# Patient Record
Sex: Female | Born: 1945 | Race: Black or African American | Hispanic: No | Marital: Married | Smoking: Former smoker
Health system: Southern US, Community
[De-identification: ages and names within clinical notes are randomized; demographics above are authoritative.]

## PROBLEM LIST (undated history)

## (undated) DIAGNOSIS — E119 Type 2 diabetes mellitus without complications: Secondary | ICD-10-CM

## (undated) DIAGNOSIS — I10 Essential (primary) hypertension: Secondary | ICD-10-CM

## (undated) HISTORY — PX: BREAST SURGERY: SHX581

---

## 2004-10-24 ENCOUNTER — Other Ambulatory Visit: Payer: Self-pay

## 2004-10-24 ENCOUNTER — Emergency Department: Payer: Self-pay | Admitting: Emergency Medicine

## 2006-03-29 ENCOUNTER — Emergency Department: Payer: Self-pay | Admitting: Internal Medicine

## 2006-07-05 ENCOUNTER — Ambulatory Visit: Payer: Self-pay | Admitting: Unknown Physician Specialty

## 2006-10-04 ENCOUNTER — Ambulatory Visit: Payer: Self-pay | Admitting: Internal Medicine

## 2011-03-15 ENCOUNTER — Emergency Department: Payer: Self-pay | Admitting: Unknown Physician Specialty

## 2011-07-24 ENCOUNTER — Ambulatory Visit: Payer: Self-pay | Admitting: Internal Medicine

## 2011-11-02 DIAGNOSIS — I10 Essential (primary) hypertension: Secondary | ICD-10-CM | POA: Diagnosis present

## 2013-12-05 ENCOUNTER — Ambulatory Visit: Payer: Self-pay | Admitting: Family Medicine

## 2014-12-06 ENCOUNTER — Inpatient Hospital Stay: Payer: Self-pay | Admitting: Internal Medicine

## 2015-02-24 NOTE — Discharge Summary (Signed)
PATIENT NAME:  [name], [name] MR#:  [id number] DATE OF BIRTH:  1946-08-15  DATE OF ADMISSION:  12/06/2014 DATE OF DISCHARGE:  12/10/2014  ADMITTING DIAGNOSES:  1.  Diabetic ketoacidosis.  2.  Altered mental status.  3.  Lactic acidosis.   DISCHARGE DIAGNOSES:  1.  Altered mental status/encephalopathy secondary to diabetic ketoacidosis.  2.  Lactic acidosis. 3.  Elevated troponin.   CONSULTATIONS: Psychiatry.   PROCEDURES: None.   BRIEF HISTORY VISIT AND HOSPITAL COURSE: The patient is a 69 year old female with history of diabetes mellitus is brought into the Emergency Department by family members because of altered mental status. Please see history and physical for details. Her blood sugar is more than 700 with pH 7.22 and anion gap of 20. The patient was given IV fluids and she was started on insulin drip. There was a concern about some drug overdose and the patient was made IVC by the ER physician and psychiatric consult was placed. The patient has diabetes with diabetic retinopathy and she is legally blind.   HOSPITAL COURSE: Based on the problems.  1.  Diabetic ketoacidosis. The patient was confused and had altered mental status that seemed to be from DKA. The patient was admitted to Intensive Care Unit, IV fluids and insulin drip was initiated per protocol.  Basic metabolic profile followed every six hours and eventually when anion gap was closed and bicarbonate was normalized insulin drip was discontinued and patient is started on Lantus subcutaneously and sliding scale insulin was initiated. Inpatient diabetic consult was placed. The patient's hemoglobin A1C was at 14, which indicates uncontrolled diabetes mellitus. Her Lantus dose was adjusted diabetic teaching and insulin teaching was provided. Eventually decision was made to discharge the patient with Lantus 30 units subcutaneously once daily.  2.  Lactic acidosis. Initially, the patient was started on IV empiric antibiotics but  cultures revealed no growth so antibiotics were subsequently discontinued.  3.  Metabolic encephalopathy secondary to severe DKA. CT head is unremarkable and eventually encephalopathy was resolved.  4.  Elevated troponin probably from demand ischemia from diabetic ketoacidosis. Echocardiogram has revealed ejection fraction greater than 55%. The patient was recommended to get a stress test done as an outpatient. The patient refused inpatient stress test. The plan is to continue aspirin, beta blocker, statin and ACE inhibitor.  5.  Possible depression is a concern for drug overdose. The patient was continued on Wellbutrin and psych has evaluated. After having a conversation with the patient, her IVC was discontinued.  6.  Deconditioning with physical therapy. Skilled nursing facility was recommended by physical therapy, but the patient prefers going home with home health. Case management was consulted and home PT and RN was arranged. As the patient is legally blind, family members, as well as the patient were trained to use insulin pen, using clicks to draw insulin.   CONDITION: Stable at the time of discharge.    CODE STATUS: She is FULL CODE.   PHYSICAL EXAMINATION:  VITAL SIGNS: At the time of discharge, temperature 98.6, pulse 91, respirations 18, blood pressure 146/70, pulse oximetry is 96% to 99%.  GENERAL APPEARANCE: Not in acute distress. Moderately built and nourished.  HEENT: Normocephalic, atraumatic. Pupils are equal, light and accommodation. No scleral icterus. No conjunctival injection. No sinus tenderness. No postnasal drip. Moist mucous membranes.  NECK: Supple. No JVD. No thyromegaly. Range of motion is intact.  LUNGS: Clear to auscultation bilaterally. No accessory muscle use and no anterior chest wall tenderness on palpation.  CARDIAC: S1, S2 normal. Regular rate and rhythm.  No murmurs.  GASTROINTESTINAL: Soft. Bowel sounds are positive in all four quadrants. Nontender,  nondistended. No masses.  NEUROLOGIC: Awake, alert and oriented x 3.  Cranial nerves  II through XII are grossly intact. No cerebellar signs.  LABORATORY DATA:  Echocardiogram: Left ventricular ejection fraction 55% to 60%, normal left ventricular systolic function, depressed left ventricular internal cavity size, mild tricuspid regurgitation. Her Accu-Cheks on February 15 were 152, 187, 148. On February 14, glucose 211, BUN normal. Creatinine, sodium, and potassium are normal. Anion gap is at 12, GFR greater than 60. Serum osmolality is 290. Calcium is 7.9. WBC 9.5, hemoglobin 7.7, hematocrit 24.3, platelets are 175,000.   MEDICATIONS AT THE TIME OF DISCHARGE:  Lantus 30 units subcutaneously once daily, gabapentin 100 mg 2 capsules p.o. once daily in the evening, bupropion 150 mg 2 tablets p.o. once daily in a.m., gabapentin 100 mg 1 capsule p.o. once daily in a.m., lisinopril 5 mg p.o. once daily, metoprolol tartrate 25 mg p.o. 2 times a day, Colace 100 mg p.o. b.i.d. as needed for constipation, aspirin 81 mg 1 tablet p.o. once daily, simvastatin 40 mg p.o. once daily at bedtime, aspart insulin subcutaneously 3 times a day with meals per protocol: Less than 150 no coverage, 151-200 use 2 units, 201-250 use 4 units, 251-300 use 6 units, 301-350 use 8 units, and 351-400 use 10 units. Discontinued acetazolamide, prednisone, as well as amlodipine.   DISPOSITION: Discharging home with home health with physical therapy nurse and nurse aide.   DIET: ADA, regular consistency.   FOLLOWUP: With the primary care physician in 1 week. Cardiology for outpatient stress test with Dr. Neoma Laming in 1-2 weeks. Outpatient diabetic clinic in 1-2 weeks.   The diagnosis and plan of care was discussed in detail with the patient and she verbalized understanding of the plan.   Total Time Spent on discharge is 45 minutes and her questions were answered.       ____________________________ Nicholes Mango,  MD ag:at D: 12/15/2014 17:29:05 ET T: 12/15/2014 18:13:02 ET JOB#: [id number]  cc: Nicholes Mango, MD, <Dictator> Dionisio David, MD

## 2015-02-24 NOTE — Consult Note (Signed)
PATIENT NAME:  [name], Stottlemyre MR#:  [id number] DATE OF BIRTH:  [birth year]  DATE OF CONSULTATION:  12/09/2014  REFERRING PHYSICIAN:    CONSULTING PHYSICIAN:  Roarke Marciano K. Dynasia Kercheval, MD  SUBJECTIVE: The patient was seen in consultation in [facility], Room 138. 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 The patient was seen with her husband and her sister-in-law. The patient was asked to be seen in consultation because she was seen hallucinating yesterday, 12/08/2014, when IVC was taken out by the floor physician. Today, the nursing staff reports that she is not hallucinating and, in fact, the 1-on-1 sitter reports that she has not had any problems with hallucinating. The patient has also stated "I was confused yesterday, not today. Tomorrow I may be." The patient has sense of humor.   PAST PSYCHIATRIC HISTORY: No previous history of inpatient hospital psychiatry, no history of suicide attempt, not being following by any psychiatrist and never had any mental issues or problems.   SOCIAL HISTORY: Denied drinking alcohol, denied street or prescription drug abuse, denied smoking nicotine cigarettes.  MENTAL STATUS: The patient is seen, sitting up and comfortable in her bed. Alert and oriented. She stated "they brought me here for help." No agitation. Affect is neutral. Mood stable. Denies feeling depressed. Denies feeling hopeless, helpless. The patient has sense of humor and was joking during the interview. Denies feeling confused and said "not now." No psychosis. Does not appear to be responding to internal stimuli. Denies auditory or visual hallucinations and says that yesterday, on 12/08/2014, she did have problem with the same. Memory is intact. General knowledge of information is fair. She knew the capital of [place] and  the name of the current president. Cognition is intact. Denies suicidal or homicidal plans. Insight and judgment fair and adequate. Impulse control is fair.   IMPRESSION: Confusion secondary to medical problems of metabolic acidosis or electrolyte disturbance.   RECOMMEND: Discontinuing involuntary commitment, as the patient is safe and does not need any IVC at this time, is cooperative and competent to deal with her problems and make decisions.     ____________________________ Wallace Cullens. Franchot Mimes, MD skc:TT D: 12/09/2014 15:28:40 ET T: 12/09/2014 15:46:07 ET JOB#: [id number]  cc: Arlyn Leak K. Franchot Mimes, MD, <Dictator> Dewain Penning MD ELECTRONICALLY SIGNED 12/15/2014 17:28

## 2015-02-24 NOTE — H&P (Signed)
PATIENT NAME:  [name], [name] MR#:  [id number] DATE OF BIRTH:  September 09, 1946  DATE OF ADMISSION:  12/06/2014  PRIMARY PHYSICIAN:Margaret Stinson  EMERGENCY ROOM PHYSICIAN: Joanne Gavel, MD   CHIEF COMPLAINT: Altered mental status.   HISTORY OF PRESENT ILLNESS: A 69 year old female patient with hypertension, diabetes, brought in by family because of altered mental status. The patient is not feeling well for the past  3 to 4 days, and she had a fall yesterday. She would not want to come to the Emergency Room. So family went to DSS and got IVC papers and called ambulance. The patient brought in by EMS because of altered mental status. The patient's thought to have some   drug overdose, so she was on the behavioral health side, but later on found out that she has DKA with severely elevated blood sugars, (BG  more than >>  700, and anion gap of 20, pH of 7.22. The patient is very confused at this time. The patient would not let the IV placed by the nurse, so ER physician gave a small dose of Ativan. After the patient became calm, right now  she is IVC. She has seen by the ER physician and reportedly she was alert, oriented and spoke to physician  but she is feeling overwhelmed and feels very tired; unable to tell if she has any complaints. History obtained from family, according to the family, she is feeling sick for 3 to 4 days, not eaten anything, associated fall last night. The patient had a fall due to balance issues. She does have diabetes and has diabetic retinopathy, and legally blind. She has no history of recent diarrhea, or nausea, or vomiting. No fever.   PAST MEDICAL HISTORY:  She has history of type 2 diabetes mellitus, hypertension, (diabetic   retinopathy.   ALLERGIES: No known allergies.    SOCIAL HISTORY: The patient lives with husband. No history of smoking or drinking.   PAST SURGICAL HISTORY: laser surgery  for the eyes for diabetic retinopathy.   FAMILY HISTORY: The patient's mother  had diabetes.   LAST HOSPITALIZATION: Not been hospitalized here.   MEDICATIONS: Reportedly, patient takes Lantus 30 units at night, and family is not sure of her metformin and Neurontin dose, and we will call her [facility] in [place] and get the list.   REVIEW OF SYSTEMS: Not obtainable because the patient is confused at this time.   PHYSICAL EXAMINATION: VITALS: Temperature is 99.4, blood pressure 185/124, saturations are 90% on room air, heart rate 134.  GENERAL: The patient is confused and unable to give any complaints.  HEENT:Atruamtic,normocephalic, Pupils reacting to light. No scleral icterus. No conjunctivitis. Hearing is intact.no turbinate hypertrophy, The patient has no pharyngeal edema. Mucous membranes are very dry.  NECK: Thyroid is nontender, nonenlarged, supple. No masses. No lymphadenopathy. No JVD.    Lungs; clear to auscultation. No rales. No wheezes. Not using accessory muscles of respiration.  CARDIOVASCULAR: S1, S2 regular. No murmurs. Tachycardic, PMI not displaced. Good pedal pulse. Good femoral pulses. No extremity edema.  ABDOMEN: Soft, nontender, nondistended. Bowel sounds present. No organomegaly. No hernias.  MUSCULOSKELETAL: Able to move all extremities x 4, no kyphosis.  SKIN: Decreased skin turgor.  NEUROLOGIC: Patient has no obvious neurological deficit.  PSYCHIATRIC: She is very confused at this time and also agitated, trying to get out of the bed.  NEUROLOGIC: Unable to be done because patient is confused, not able to participate but grossly has no fascial droop and no  flaccidity.   DIAGNOSTIC DATA:  1.  The patient's electrolytes show sodium 125, potassium 4.1, chloride 90, bicarbonate 15, BUN 29, creatinine 1.63, glucose 721; the patient's ALT is 10, AST is 8, patient's anion gap of 20; WBC 21.4, hemoglobin 10.8, hematocrit 34.8, platelets 236,000; patient's lipase 111,  lactic acid 4.5.  2.  Chest x-ray shows no active disease.  3.  CT head shows no  intracranial abnormalities,soft tissue injury   consistent with recent fall. 4.  Magnesium 1.9, troponin less than 0.02, phosphorus 3.   ASSESSMENT AND PLAN: 1.  This is a 69 year old female patient with altered mental status likely secondary to severe diabetic ketoacidosis. The patient is admitted to intensive care unit, started on insulin drip and IV fluids. Patient has hyponatremia, acute renal failure secondary to be diabetic ketoacidosis. Check  BMP every 6 hours and adjust the fluids. confusion;Metabolic   encephalopathy secondary to severe diabetic ketoacidosis. CT head is unremarkable; the patient right now is monitored closely and use goedon  20 mg q12 IM  as needed for agitation.  3.  Lactic acidosis; Empirically started on broad-spectrum antibiotics. follow blood cultures,if negative,d/c abx. 4.  Deep vein thrombosis prophylaxis with Lovenox.   Discussed the plan with the husband and patient's son in detail.   Time spent more than 60 minutes.   This is a critical care H and P.    ____________________________ Epifanio Lesches, MD sk:nt D: 12/06/2014 20:00:14 ET T: 12/06/2014 20:38:56 ET JOB#: [id number]  cc: Epifanio Lesches, MD, <Dictator> Epifanio Lesches MD ELECTRONICALLY SIGNED 12/11/2014 21:45

## 2015-02-24 NOTE — Discharge Summary (Signed)
PATIENT NAME:  [name], [name] MR#:  [id number] DATE OF BIRTH:  [birth year]  DATE OF ADMISSION:  12/06/2014 DATE OF DISCHARGE:  12/10/2014  ADMITTING DIAGNOSES:  1.  Diabetic ketoacidosis.  2.  Altered mental status.  3.  Lactic acidosis.   DISCHARGE DIAGNOSES:  1.  Altered mental status/encephalopathy secondary to diabetic ketoacidosis.  2.  Lactic acidosis. 3.  Elevated troponin.   CONSULTATIONS: Psychiatry.   PROCEDURES: None.   BRIEF HISTORY VISIT AND HOSPITAL COURSE: The patient is a 69 year old female with history of diabetes mellitus is brought into the Emergency Department by family members because of altered mental status. Please see history and physical for details. Her blood sugar is more than 700 with pH 7.22 and anion gap of 20. The patient was given IV fluids and she was started on insulin drip. There was a concern about some drug overdose and the patient was made IVC by the ER physician and psychiatric consult was placed. The patient has diabetes with diabetic retinopathy and she is legally blind.   HOSPITAL COURSE: Based on the problems.  1. Diabetic ketoacidosis. The patient was confused and had altered mental status that seemed to be from DKA. The patient was admitted to Intensive Care Unit, IV fluids and insulin drip was initiated per protocol.  Basic metabolic profile followed every six hours and eventually when anion gap was closed and bicarbonate was normalized insulin drip was discontinued and patient is started on Lantus subcutaneously and sliding scale insulin was initiated. Inpatient diabetic consult was placed. The patient's hemoglobin A1C was at 14, which indicates uncontrolled diabetes mellitus. Her Lantus dose was adjusted diabetic teaching and insulin teaching was provided. Eventually decision was made to discharge the patient with Lantus 30 units subcutaneously once daily.  2. Lactic acidosis. Initially, the patient was started on IV empiric antibiotics but  cultures revealed no growth so antibiotics were subsequently discontinued.  3. Metabolic encephalopathy secondary to severe DKA. CT head is unremarkable and eventually encephalopathy was resolved.  4. Elevated troponin probably from demand ischemia from diabetic ketoacidosis. Echocardiogram has revealed ejection fraction greater than 55%. The patient was recommended to get a stress test done as an outpatient. The patient refused inpatient stress test. The plan is to continue aspirin, beta blocker, statin and ACE inhibitor.  5. Possible depression is a concern for drug overdose. The patient was continued on Wellbutrin and psych has evaluated. After having a conversation with the patient, her IVC was discontinued.   6. Deconditioning with physical therapy. Skilled nursing facility was recommended by physical therapy, but the patient prefers going home with home health. Case management was consulted and home PT and RN was arranged. As the patient is legally blind, family members, as well as the patient were trained to use insulin pen, using clicks to draw insulin.   CONDITION: Stable at the time of discharge.    CODE STATUS: She is FULL CODE.   PHYSICAL EXAMINATION:  VITAL SIGNS: At the time of discharge, temperature 98.6, pulse 91, respirations 18, blood pressure 146/70, pulse oximetry is 96% to 99%.  GENERAL APPEARANCE: Not in acute distress. Moderately built and nourished.  HEENT: Normocephalic, atraumatic. Pupils are equal, light and accommodation. No scleral icterus. No conjunctival injection. No sinus tenderness. No postnasal drip. Moist mucous membranes.  NECK: Supple. No JVD. No thyromegaly. Range of motion is intact.  LUNGS: Clear to auscultation bilaterally. No accessory muscle use and no anterior chest wall tenderness on palpation.  CARDIAC: S1, S2 normal. Regular  rate and rhythm.  No murmurs.  GASTROINTESTINAL: Soft. Bowel sounds are positive in all four quadrants. Nontender, nondistended.  No masses.  NEUROLOGIC: Awake, alert and oriented x 3.  Cranial nerves  II through XII are grossly intact. No cerebellar signs.  LABORATORY DATA:  Echocardiogram: Left ventricular ejection fraction 55% to 60%, normal left ventricular systolic function, depressed left ventricular internal cavity size, mild tricuspid regurgitation. Her Accu-Cheks on February 15 were 152, 187, 148. On February 14, glucose 211, BUN normal. Creatinine, sodium, and potassium are normal. Anion gap is at 12, GFR greater than 60. Serum osmolality is 290. Calcium is 7.9. WBC 9.5, hemoglobin 7.7, hematocrit 24.3, platelets are 175,000.   MEDICATIONS AT THE TIME OF DISCHARGE:  Lantus 30 units subcutaneously once daily, gabapentin 100 mg 2 capsules p.o. once daily in the evening, bupropion 150 mg 2 tablets p.o. once daily in a.m., gabapentin 100 mg 1 capsule p.o. once daily in a.m., lisinopril 5 mg p.o. once daily, metoprolol tartrate 25 mg p.o. 2 times a day, Colace 100 mg p.o. b.i.d. as needed for constipation, aspirin 81 mg 1 tablet p.o. once daily, simvastatin 40 mg p.o. once daily at bedtime, aspart insulin subcutaneously 3 times a day with meals per protocol: Less than 150 no coverage, 151-200 use 2 units, 201-250 use 4 units, 251-300 use 6 units, 301-350 use 8 units, and 351-400 use 10 units. Discontinued acetazolamide, prednisone, as well as amlodipine.   DISPOSITION: Discharging home with home health with physical therapy nurse and nurse aide.   DIET: ADA, regular consistency.   FOLLOWUP: With the primary care physician in 1 week. Cardiology for outpatient stress test with Dr. Neoma Laming in 1-2 weeks. Outpatient diabetic clinic in 1-2 weeks.   The diagnosis and plan of care was discussed in detail with the patient and she verbalized understanding of the plan.   Total Time Spent on discharge is 45 minutes and her questions were answered.      ____________________________ Nicholes Mango, MD ag:at D: 12/15/2014 17:29:00  ET T: 12/15/2014 18:13:02 ET JOB#: [id number]  cc: Dionisio David, MD Nicholes Mango, MD, <Dictator>  Nicholes Mango MD ELECTRONICALLY SIGNED 12/21/2014 14:42

## 2015-02-24 NOTE — Consult Note (Signed)
PATIENT NAME:  [name], Vater MR#:  [id number] DATE OF BIRTH:  1945/11/22  DATE OF CONSULTATION:  12/06/2014  REFERRING PHYSICIAN:   CONSULTING PHYSICIAN:  Gonzella Lex, MD  IDENTIFYING INFORMATION AND REASON FOR CONSULT: A 69 year old woman brought to the hospital yesterday unresponsive. Consult to follow up on involuntary commitment.   HISTORY OF PRESENT ILLNESS: Information obtained only from the chart at this time. The patient was brought to the hospital yesterday. According to the intake note, her family took out commitment paperwork to allow for her to be brought into the hospital. From what I saw in the chart, the adult protective services worker actually took out the commitment paperwork so I am not sure whether the family were specifically involved in that. In any case, the idea seems to be that they felt she was taking very poor care of herself and seemed to be in poor health at home and was refusing medical treatment. The patient is currently sleeping so hard I could not wake her up with reasonable speaking and light shaking and I have not been able to get any history from her.   PAST PSYCHIATRIC HISTORY: No known past psychiatric history identified.   SOCIAL HISTORY: It is mentioned in the paperwork that the patient has a husband and a son. I see the son's phone number listed. It is not clear to me whether they all live together or not.   PAST MEDICAL HISTORY: History of diabetes with diabetic retinopathy and blindness.   FAMILY HISTORY: Unknown.   SUBSTANCE ABUSE HISTORY:  Unknown.   REVIEW OF SYSTEMS: The patient unable to offer.   MENTAL STATUS EXAMINATION: The patient is asleep in bed. She looks considerably older than her stated age and looks in chronically poor health, even sleeping. I was not able to wake her with speaking her name and lightly touching her shoulder. The nurse on duty informed me that the patient had been sleeping hard for a long time now. I was not able to  get any further mental status review.   LABORATORY RESULTS: CAT scan apparently showed no acute intracranial abnormality. Her blood sugars on presentation were high with a maximum read at 700.  It has come down now and the rest of her electrolytes are improving as well. Drug screen negative.   ASSESSMENT: This is a 69 year old woman who appears to have been taking rather poor care of herself. I do not know at this point whether that is from her own volition or whether she is unable to care for herself from any other mental defect. Right now, I am not able to get any other history. The staff had asked me to see the patient to follow up on her involuntary commitment. Because of my lack of further information at this point, I am not comfortable discontinuing the commitment until I can speak to the patient more directly and get an idea as to what her mental state is.   TREATMENT PLAN: No indication for any psychiatric medicine intervention. I am leaving the commitment right now. I believe Dr. Franchot Mimes will be working over the weekend and if nursing or the staff physicians would like the patient to be re-evaluated as far as her commitment, they can call Dr. Franchot Mimes.  Otherwise, I will check in on the patient after the weekend.   DIAGNOSIS, PRINCIPAL AND PRIMARY:  AXIS I:  Delirium due to multiple medical conditions including diabetic ketoacidosis.     ____________________________ Gonzella Lex, MD jtc:by  D: 12/07/2014 21:24:31 ET T: 12/07/2014 22:10:11 ET JOB#: [id number]  cc: Gonzella Lex, MD, <Dictator> Gonzella Lex MD ELECTRONICALLY SIGNED 12/10/2014 10:44

## 2016-01-19 ENCOUNTER — Ambulatory Visit
Admission: EM | Admit: 2016-01-19 | Discharge: 2016-01-19 | Disposition: A | Discharge status: Home / Self Care | Payer: Medicare Other | Attending: Family Medicine | Admitting: Family Medicine

## 2016-01-19 ENCOUNTER — Encounter: Payer: Self-pay | Admitting: *Deleted

## 2016-01-19 DIAGNOSIS — E7800 Pure hypercholesterolemia, unspecified: Secondary | ICD-10-CM | POA: Insufficient documentation

## 2016-01-19 DIAGNOSIS — Z87891 Personal history of nicotine dependence: Secondary | ICD-10-CM | POA: Diagnosis not present

## 2016-01-19 DIAGNOSIS — K2970 Gastritis, unspecified, without bleeding: Secondary | ICD-10-CM | POA: Diagnosis not present

## 2016-01-19 DIAGNOSIS — I10 Essential (primary) hypertension: Secondary | ICD-10-CM | POA: Insufficient documentation

## 2016-01-19 DIAGNOSIS — Z79899 Other long term (current) drug therapy: Secondary | ICD-10-CM | POA: Diagnosis not present

## 2016-01-19 DIAGNOSIS — R079 Chest pain, unspecified: Secondary | ICD-10-CM | POA: Diagnosis present

## 2016-01-19 DIAGNOSIS — Z794 Long term (current) use of insulin: Secondary | ICD-10-CM | POA: Insufficient documentation

## 2016-01-19 DIAGNOSIS — E119 Type 2 diabetes mellitus without complications: Secondary | ICD-10-CM | POA: Diagnosis not present

## 2016-01-19 HISTORY — DX: Essential (primary) hypertension: I10

## 2016-01-19 HISTORY — DX: Type 2 diabetes mellitus without complications: E11.9

## 2016-01-19 MED ORDER — PANTOPRAZOLE SODIUM 20 MG PO TBEC: 20.0000 mg | DELAYED_RELEASE_TABLET | Freq: Every day | ORAL | Status: DC

## 2016-01-19 NOTE — ED Provider Notes (Signed)
CSN: [id number]     Arrival date & time 01/19/16  1445 History   First MD Initiated Contact with Patient 01/19/16 1659     Chief Complaint  Patient presents with  . Chest Pain   (Consider location/radiation/quality/duration/timing/severity/associated sxs/prior Treatment) Patient is a 70 y.o. female presenting with chest pain. The history is provided by the patient.  Chest Pain Pain location:  Epigastric Pain quality: burning and crushing   Pain quality: not aching, no pressure, not sharp, not stabbing and no tightness   Pain radiates to:  Lower back Pain severity:  Moderate Onset quality:  Gradual Timing:  Intermittent Progression:  Waxing and waning Chronicity:  New Relieved by:  Antacids Worsened by:  Nothing tried Associated symptoms: no abdominal pain, no anorexia, no anxiety, no back pain, no cough, no dysphagia, no nausea, no palpitations, no shortness of breath and not vomiting   Risk factors: diabetes mellitus, high cholesterol and hypertension     Past Medical History  Diagnosis Date  . Diabetes mellitus without complication ([place])   . Hypertension    History reviewed. No pertinent past surgical history. History reviewed. No pertinent family history. Social History  Substance Use Topics  . Smoking status: Former Research scientist (life sciences)  . Smokeless tobacco: None  . Alcohol Use: No   OB History    No data available     Review of Systems  Constitutional: Negative.   HENT: Negative.  Negative for trouble swallowing.   Eyes: Negative.   Respiratory: Negative.  Negative for cough, chest tightness, shortness of breath and wheezing.   Cardiovascular: Positive for chest pain. Negative for palpitations.  Gastrointestinal: Negative for nausea, vomiting, abdominal pain, diarrhea and anorexia.  Endocrine: Negative.   Genitourinary: Negative.   Musculoskeletal: Negative.  Negative for myalgias, back pain, neck pain and neck stiffness.  Skin: Negative.   Neurological: Negative.    Hematological: Negative.   Psychiatric/Behavioral: Negative.     Allergies  Lyrica  Home Medications   Prior to Admission medications   Medication Sig Start Date End Date Taking? Authorizing Provider  amLODipine (NORVASC) 2.5 MG tablet Take 2.5 mg by mouth daily.   Yes Historical Provider, MD  buPROPion (WELLBUTRIN) 75 MG tablet Take 75 mg by mouth 2 (two) times daily.   Yes Historical Provider, MD  gabapentin (NEURONTIN) 300 MG capsule Take 300 mg by mouth 3 (three) times daily.   Yes Historical Provider, MD  insulin glargine (LANTUS) 100 UNIT/ML injection Inject into the skin at bedtime.   Yes Historical Provider, MD  metFORMIN (GLUMETZA) 500 MG (MOD) 24 hr tablet Take 500 mg by mouth daily with breakfast.   Yes Historical Provider, MD  traMADol (ULTRAM) 50 MG tablet Take by mouth every 6 (six) hours as needed.   Yes Historical Provider, MD  pantoprazole (PROTONIX) 20 MG tablet Take 1 tablet (20 mg total) by mouth daily. 01/19/16   Juline Patch, MD   Meds Ordered and Administered this Visit  Medications - No data to display  BP 141/75 mmHg  Pulse 101  Temp(Src) 98.3 F (36.8 C) (Oral)  Resp 16  Ht 5' (1.524 m)  Wt 125 lb (56.7 kg)  BMI 24.41 kg/m2  SpO2 99% No data found.   Physical Exam  Constitutional: She is oriented to person, place, and time. She appears well-developed.  HENT:  Head: Normocephalic.  Right Ear: External ear normal.  Left Ear: External ear normal.  Nose: Nose normal.  Mouth/Throat: Oropharynx is clear and moist.  Eyes: Pupils  are equal, round, and reactive to light. Right eye exhibits no discharge. Left eye exhibits no discharge.  Neck: Normal range of motion. Neck supple.  Cardiovascular: Normal rate, regular rhythm, normal heart sounds and intact distal pulses.  Exam reveals no gallop and no friction rub.   No murmur heard. Pulmonary/Chest: Effort normal and breath sounds normal. No respiratory distress. She has no wheezes. She has no rales.  She exhibits no tenderness.  Abdominal: Soft. Bowel sounds are normal. She exhibits no distension. There is no tenderness. There is no rebound and no guarding.  Musculoskeletal: Normal range of motion.  Neurological: She is alert and oriented to person, place, and time.  Skin: Skin is warm and dry.  Psychiatric: She has a normal mood and affect. Judgment normal.    ED Course  Procedures (including critical care time)  Labs Review Labs Reviewed - No data to display  Imaging Review No results found.   Visual Acuity Review  Right Eye Distance:   Left Eye Distance:   Bilateral Distance:    Right Eye Near:   Left Eye Near:    Bilateral Near:         MDM   1. Chest pain, unspecified chest pain type   2. Gastritis    New Prescriptions   PANTOPRAZOLE (PROTONIX) 20 MG TABLET    Take 1 tablet (20 mg total) by mouth daily.      Juline Patch, MD 01/20/16 [phone]

## 2016-01-19 NOTE — ED Notes (Signed)
Low left anterior rib pain radiating to back. Pain worse when lying down and awakens pt during sleep.

## 2016-01-19 NOTE — Discharge Instructions (Signed)

## 2016-02-15 ENCOUNTER — Other Ambulatory Visit: Payer: Self-pay | Admitting: Family Medicine

## 2017-12-30 DIAGNOSIS — E1122 Type 2 diabetes mellitus with diabetic chronic kidney disease: Secondary | ICD-10-CM | POA: Insufficient documentation

## 2019-12-05 DIAGNOSIS — Z171 Estrogen receptor negative status [ER-]: Secondary | ICD-10-CM

## 2022-02-09 ENCOUNTER — Encounter: Payer: Self-pay | Admitting: Emergency Medicine

## 2022-02-09 ENCOUNTER — Emergency Department: Payer: BC Managed Care – PPO

## 2022-02-09 ENCOUNTER — Inpatient Hospital Stay
Admission: EM | Admit: 2022-02-09 | Discharge: 2022-02-13 | DRG: 065 | Disposition: A | Discharge status: Home Health | Payer: BC Managed Care – PPO | Attending: Internal Medicine | Admitting: Internal Medicine

## 2022-02-09 ENCOUNTER — Other Ambulatory Visit: Payer: Self-pay

## 2022-02-09 DIAGNOSIS — E785 Hyperlipidemia, unspecified: Secondary | ICD-10-CM | POA: Diagnosis present

## 2022-02-09 DIAGNOSIS — I6389 Other cerebral infarction: Secondary | ICD-10-CM | POA: Diagnosis present

## 2022-02-09 DIAGNOSIS — E1122 Type 2 diabetes mellitus with diabetic chronic kidney disease: Secondary | ICD-10-CM

## 2022-02-09 DIAGNOSIS — E1169 Type 2 diabetes mellitus with other specified complication: Secondary | ICD-10-CM

## 2022-02-09 DIAGNOSIS — Z794 Long term (current) use of insulin: Secondary | ICD-10-CM

## 2022-02-09 DIAGNOSIS — I639 Cerebral infarction, unspecified: Secondary | ICD-10-CM | POA: Diagnosis not present

## 2022-02-09 DIAGNOSIS — E1142 Type 2 diabetes mellitus with diabetic polyneuropathy: Secondary | ICD-10-CM | POA: Diagnosis present

## 2022-02-09 DIAGNOSIS — I129 Hypertensive chronic kidney disease with stage 1 through stage 4 chronic kidney disease, or unspecified chronic kidney disease: Secondary | ICD-10-CM | POA: Diagnosis present

## 2022-02-09 DIAGNOSIS — Z7984 Long term (current) use of oral hypoglycemic drugs: Secondary | ICD-10-CM | POA: Diagnosis not present

## 2022-02-09 DIAGNOSIS — Z87891 Personal history of nicotine dependence: Secondary | ICD-10-CM | POA: Diagnosis not present

## 2022-02-09 DIAGNOSIS — Z171 Estrogen receptor negative status [ER-]: Secondary | ICD-10-CM | POA: Diagnosis not present

## 2022-02-09 DIAGNOSIS — E11319 Type 2 diabetes mellitus with unspecified diabetic retinopathy without macular edema: Secondary | ICD-10-CM | POA: Diagnosis present

## 2022-02-09 DIAGNOSIS — R29810 Facial weakness: Secondary | ICD-10-CM

## 2022-02-09 DIAGNOSIS — N184 Chronic kidney disease, stage 4 (severe): Secondary | ICD-10-CM | POA: Diagnosis present

## 2022-02-09 DIAGNOSIS — D631 Anemia in chronic kidney disease: Secondary | ICD-10-CM | POA: Diagnosis present

## 2022-02-09 DIAGNOSIS — I69354 Hemiplegia and hemiparesis following cerebral infarction affecting left non-dominant side: Secondary | ICD-10-CM

## 2022-02-09 DIAGNOSIS — Z853 Personal history of malignant neoplasm of breast: Secondary | ICD-10-CM

## 2022-02-09 DIAGNOSIS — I10 Essential (primary) hypertension: Secondary | ICD-10-CM | POA: Diagnosis present

## 2022-02-09 DIAGNOSIS — D696 Thrombocytopenia, unspecified: Secondary | ICD-10-CM | POA: Diagnosis present

## 2022-02-09 DIAGNOSIS — H409 Unspecified glaucoma: Secondary | ICD-10-CM | POA: Diagnosis present

## 2022-02-09 DIAGNOSIS — H547 Unspecified visual loss: Secondary | ICD-10-CM | POA: Diagnosis present

## 2022-02-09 DIAGNOSIS — Z79899 Other long term (current) drug therapy: Secondary | ICD-10-CM | POA: Diagnosis not present

## 2022-02-09 DIAGNOSIS — C50811 Malignant neoplasm of overlapping sites of right female breast: Secondary | ICD-10-CM

## 2022-02-09 DIAGNOSIS — N179 Acute kidney failure, unspecified: Secondary | ICD-10-CM | POA: Diagnosis present

## 2022-02-09 DIAGNOSIS — E1165 Type 2 diabetes mellitus with hyperglycemia: Secondary | ICD-10-CM | POA: Diagnosis present

## 2022-02-09 LAB — COMPREHENSIVE METABOLIC PANEL
ALT: 6 U/L (ref 0–44)
AST: 11 U/L — ABNORMAL LOW (ref 15–41)
Albumin: 3.7 g/dL (ref 3.5–5.0)
Alkaline Phosphatase: 57 U/L (ref 38–126)
Anion gap: 12 (ref 5–15)
BUN: 55 mg/dL — ABNORMAL HIGH (ref 8–23)
CO2: 21 mmol/L — ABNORMAL LOW (ref 22–32)
Calcium: 9.2 mg/dL (ref 8.9–10.3)
Chloride: 106 mmol/L (ref 98–111)
Creatinine, Ser: 3.54 mg/dL — ABNORMAL HIGH (ref 0.44–1.00)
GFR, Estimated: 13 mL/min — ABNORMAL LOW (ref >60)
Glucose, Bld: 187 mg/dL — ABNORMAL HIGH (ref 70–99)
Potassium: 3.9 mmol/L (ref 3.5–5.1)
Sodium: 139 mmol/L (ref 135–145)
Total Bilirubin: 0.2 mg/dL — ABNORMAL LOW (ref 0.3–1.2)
Total Protein: 7 g/dL (ref 6.5–8.1)

## 2022-02-09 LAB — URINALYSIS, ROUTINE W REFLEX MICROSCOPIC
Bacteria, UA: NONE SEEN
Bilirubin Urine: NEGATIVE
Glucose, UA: 150 mg/dL — AB
Hgb urine dipstick: NEGATIVE
Ketones, ur: NEGATIVE mg/dL
Leukocytes,Ua: NEGATIVE
Nitrite: NEGATIVE
Protein, ur: >=300 mg/dL — AB
Specific Gravity, Urine: 1.012 (ref 1.005–1.030)
pH: 6 (ref 5.0–8.0)

## 2022-02-09 LAB — DIFFERENTIAL
Abs Immature Granulocytes: 0.04 10*3/uL (ref 0.00–0.07)
Basophils Absolute: 0 10*3/uL (ref 0.0–0.1)
Basophils Relative: 0 %
Eosinophils Absolute: 0 10*3/uL (ref 0.0–0.5)
Eosinophils Relative: 0 %
Immature Granulocytes: 1 %
Lymphocytes Relative: 33 %
Lymphs Abs: 1.5 10*3/uL (ref 0.7–4.0)
Monocytes Absolute: 1 10*3/uL (ref 0.1–1.0)
Monocytes Relative: 22 %
Neutro Abs: 2 10*3/uL (ref 1.7–7.7)
Neutrophils Relative %: 44 %
Smear Review: NORMAL

## 2022-02-09 LAB — CBC
HCT: 28.4 % — ABNORMAL LOW (ref 36.0–46.0)
Hemoglobin: 8.7 g/dL — ABNORMAL LOW (ref 12.0–15.0)
MCH: 28.6 pg (ref 26.0–34.0)
MCHC: 30.6 g/dL (ref 30.0–36.0)
MCV: 93.4 fL (ref 80.0–100.0)
Platelets: 111 10*3/uL — ABNORMAL LOW (ref 150–400)
RBC: 3.04 MIL/uL — ABNORMAL LOW (ref 3.87–5.11)
RDW: 14.9 % (ref 11.5–15.5)
WBC: 4.6 10*3/uL (ref 4.0–10.5)
nRBC: 0 % (ref 0.0–0.2)

## 2022-02-09 LAB — CBG MONITORING, ED
Glucose-Capillary: 190 mg/dL — ABNORMAL HIGH (ref 70–99)
Glucose-Capillary: 173 mg/dL — ABNORMAL HIGH (ref 70–99)

## 2022-02-09 LAB — PROTIME-INR
INR: 1 (ref 0.8–1.2)
Prothrombin Time: 13.4 seconds (ref 11.4–15.2)

## 2022-02-09 LAB — GLUCOSE, CAPILLARY: Glucose-Capillary: 127 mg/dL — ABNORMAL HIGH (ref 70–99)

## 2022-02-09 LAB — APTT: aPTT: 38 seconds — ABNORMAL HIGH (ref 24–36)

## 2022-02-09 MED ORDER — ASPIRIN 81 MG PO CHEW
324.0000 mg | CHEWABLE_TABLET | Freq: Once | ORAL | Status: AC
  Administered 2022-02-09: 324 mg via ORAL
  Filled 2022-02-09: qty 4

## 2022-02-09 MED ORDER — INSULIN ASPART 100 UNIT/ML IJ SOLN
0.0000 [IU] | INTRAMUSCULAR | Status: DC
  Administered 2022-02-10: 2 [IU] via SUBCUTANEOUS
  Administered 2022-02-10: 3 [IU] via SUBCUTANEOUS
  Administered 2022-02-10 (×2): 2 [IU] via SUBCUTANEOUS
  Administered 2022-02-11: 1 [IU] via SUBCUTANEOUS
  Administered 2022-02-11 (×2): 2 [IU] via SUBCUTANEOUS
  Administered 2022-02-11: 1 [IU] via SUBCUTANEOUS
  Administered 2022-02-12: 2 [IU] via SUBCUTANEOUS
  Administered 2022-02-12: 1 [IU] via SUBCUTANEOUS
  Administered 2022-02-13: 2 [IU] via SUBCUTANEOUS
  Administered 2022-02-13: 1 [IU] via SUBCUTANEOUS
  Administered 2022-02-13: 2 [IU] via SUBCUTANEOUS
  Filled 2022-02-09 (×13): qty 1

## 2022-02-09 MED ORDER — SODIUM CHLORIDE 0.9 % IV SOLN: INTRAVENOUS | Status: DC

## 2022-02-09 MED ORDER — STROKE: EARLY STAGES OF RECOVERY BOOK: Freq: Once | Status: DC

## 2022-02-09 MED ORDER — ATORVASTATIN CALCIUM 80 MG PO TABS
80.0000 mg | ORAL_TABLET | Freq: Every day | ORAL | Status: DC
  Administered 2022-02-10 – 2022-02-13 (×4): 80 mg via ORAL
  Filled 2022-02-09 (×4): qty 1

## 2022-02-09 MED ORDER — ACETAMINOPHEN 160 MG/5ML PO SOLN
650.0000 mg | ORAL | Status: DC | PRN
  Filled 2022-02-09: qty 20.3

## 2022-02-09 MED ORDER — GADOBUTROL 1 MMOL/ML IV SOLN
5.0000 mL | Freq: Once | INTRAVENOUS | Status: AC | PRN
  Administered 2022-02-09: 5 mL via INTRAVENOUS

## 2022-02-09 MED ORDER — SODIUM CHLORIDE 0.9 % IV BOLUS
1000.0000 mL | Freq: Once | INTRAVENOUS | Status: AC
Start: 2022-02-09 — End: 2022-02-09
  Administered 2022-02-09: 1000 mL via INTRAVENOUS

## 2022-02-09 MED ORDER — CLOPIDOGREL BISULFATE 75 MG PO TABS
75.0000 mg | ORAL_TABLET | Freq: Every day | ORAL | Status: DC
  Administered 2022-02-10 – 2022-02-13 (×4): 75 mg via ORAL
  Filled 2022-02-09 (×4): qty 1

## 2022-02-09 MED ORDER — ASPIRIN EC 81 MG PO TBEC
81.0000 mg | DELAYED_RELEASE_TABLET | Freq: Every day | ORAL | Status: DC
  Administered 2022-02-09 – 2022-02-13 (×5): 81 mg via ORAL
  Filled 2022-02-09 (×5): qty 1

## 2022-02-09 MED ORDER — SODIUM CHLORIDE 0.9% FLUSH
3.0000 mL | Freq: Once | INTRAVENOUS | Status: AC
  Administered 2022-02-09: 3 mL via INTRAVENOUS

## 2022-02-09 MED ORDER — ACETAMINOPHEN 650 MG RE SUPP: 650.0000 mg | RECTAL | Status: DC | PRN

## 2022-02-09 MED ORDER — ACETAMINOPHEN 325 MG PO TABS
650.0000 mg | ORAL_TABLET | ORAL | Status: DC | PRN
  Administered 2022-02-10: 650 mg via ORAL
  Filled 2022-02-09: qty 2

## 2022-02-09 NOTE — Assessment & Plan Note (Addendum)
Allow permissive hypertension in the setting of  CVA. ?Treat if SBP above 180 ? ?

## 2022-02-09 NOTE — ED Notes (Signed)
Pt taken to CT at this time.

## 2022-02-09 NOTE — Assessment & Plan Note (Addendum)
No acute issues suspected 

## 2022-02-09 NOTE — ED Triage Notes (Signed)
Left side facial droop since 330pm.. family memebers says they notied at 97, but the patient was seen by speech at around 3pm and they did not notice it. ?

## 2022-02-09 NOTE — Assessment & Plan Note (Addendum)
Serum creatinine at admission 3.54, which is up from her baseline 2.48 ?Avoid nephrotoxic medications, monitor renal functions. ?Gentle IV hydration. ? ?

## 2022-02-09 NOTE — Assessment & Plan Note (Signed)
Hold gabapentin due to AKI ?

## 2022-02-09 NOTE — Assessment & Plan Note (Signed)
Continue home eyedrops °

## 2022-02-09 NOTE — Plan of Care (Signed)
?  Problem: Education: Goal: Knowledge of disease or condition will improve Outcome: Progressing Goal: Knowledge of secondary prevention will improve (SELECT ALL) Outcome: Progressing Goal: Knowledge of patient specific risk factors will improve (INDIVIDUALIZE FOR PATIENT) Outcome: Progressing Goal: Individualized Educational Video(s) Outcome: Progressing   Problem: Coping: Goal: Will verbalize positive feelings about self Outcome: Progressing Goal: Will identify appropriate support needs Outcome: Progressing   Problem: Health Behavior/Discharge Planning: Goal: Ability to manage health-related needs will improve Outcome: Progressing   

## 2022-02-09 NOTE — H&P (Signed)
?History and Physical  ? ? ?Patient: [name]:[id number] DOB: 01-30-1946 ?DOA: 02/09/2022 ?DOS: the patient was seen and examined on 02/09/2022 ?PCP: Pcp, No  ?Patient coming from: Home ? ?Chief Complaint:  ?Chief Complaint  ?Patient presents with  ? Facial Droop  ? ? ?HPI: [name] is a 76 y.o. female with medical history significant for Non-insulin type 2 diabetes who previously declined insulin (A1c 10.6 on 01/28/2022), HTN, HLD, ER-negative breast cancer, diabetic neuropathy, glaucoma, CKD 4, recent right MCA stroke for which she was hospitalized at [facility] from 4/5 to 02/02/2022 now with residual left upper and lower extremity weakness and left facial weakness and discharged with home health who was sent from the facility with concerns for worsening of her left facial droop coming into the ED as a code stroke.  Last known well was at 9 AM and symptoms started at 3:30 PM.  Patient is currently on aspirin 81 mg as well as atorvastatin.  CT head showed a possible hypodensity in the right frontal lobe involving the precentral gyrus that is new possibly result representing new infarct.  Patient was seen in the ED by neurologist, Dr. Cheral Marker who recommended MRI/MRA and additional stroke work-up.  Hospitalist consulted for admission. ?Additional data review: Vitals in the ED were within normal limits.  Blood work significant for creatinine of 3.54, up from 2.48 on 4/5, and hemoglobin 8.7 (Hb 8.4 on 4/5).  EKG, personally viewed and interpreted with sinus rhythm at 71 with no acute ST-T wave changes.  Chest x-ray with faint chronic bilateral upper lobe interstitial densities. ? ?  ? ?Past Medical History:  ?Diagnosis Date  ? Diabetes mellitus without complication ([place])   ? Hypertension   ? ?Past Surgical History:  ?Procedure Laterality Date  ? BREAST SURGERY    ? ?Social History:  reports that she has quit smoking. She does not have any smokeless tobacco history on file. She reports that she does not drink  alcohol. No history on file for drug use. ? ?Allergies  ?Allergen Reactions  ? Lyrica [Pregabalin] Other (See Comments)  ?  Suicidal/homocidal ideation.  ? ? ?No family history on file. ? ?Prior to Admission medications   ?Medication Sig Start Date End Date Taking? Authorizing Provider  ?amLODipine (NORVASC) 2.5 MG tablet Take 2.5 mg by mouth daily.    [provider]  ?buPROPion (WELLBUTRIN) 75 MG tablet Take 75 mg by mouth 2 (two) times daily.    [provider]  ?gabapentin (NEURONTIN) 300 MG capsule Take 300 mg by mouth 3 (three) times daily.    [provider]  ?insulin glargine (LANTUS) 100 UNIT/ML injection Inject into the skin at bedtime.    [provider]  ?metFORMIN (GLUMETZA) 500 MG (MOD) 24 hr tablet Take 500 mg by mouth daily with breakfast.    [provider]  ?pantoprazole (PROTONIX) 20 MG tablet Take 1 tablet (20 mg total) by mouth daily. 01/19/16   Juline Patch, MD  ?traMADol (ULTRAM) 50 MG tablet Take by mouth every 6 (six) hours as needed.    [provider]  ? ? ?Physical Exam: ?Vitals:  ? 02/09/22 1730 02/09/22 1735 02/09/22 1739 02/09/22 1800  ?BP: (!) 159/83   124/65  ?Pulse:  65 66 65  ?Resp:  '15 20 12  '$ ?Temp:      ?TempSrc:      ?SpO2:  100% 100% 100%  ?Weight:      ?Height:      ? ?  Physical Exam ?Vitals and nursing note reviewed.  ?Constitutional:   ?   General: She is not in acute distress. ?HENT:  ?   Head: Normocephalic and atraumatic.  ?Cardiovascular:  ?   Rate and Rhythm: Normal rate and regular rhythm.  ?   Pulses: Normal pulses.  ?   Heart sounds: Normal heart sounds.  ?Pulmonary:  ?   Effort: Pulmonary effort is normal.  ?   Breath sounds: Normal breath sounds.  ?Abdominal:  ?   Palpations: Abdomen is soft.  ?   Tenderness: There is no abdominal tenderness.  ?Neurological:  ?   Motor: Weakness present.  ?   Comments: Right sided  ? ? ? ?Data Reviewed: ?Relevant notes from primary care and specialist visits, past discharge  summaries as available in EHR, including Care Everywhere. ?Prior diagnostic testing as pertinent to current admission diagnoses ?Updated medications and problem lists for reconciliation ?ED course, including vitals, labs, imaging, treatment and response to treatment ?Triage notes, nursing and pharmacy notes and ED provider's notes ?Notable results as noted in HPI ? ? ?Assessment and Plan: ?* Acute CVA (cerebrovascular accident), recurrent(HCC) ?Patient with history of CVA on 4/5 with residual left hemiparesis presenting with left facial droop and possible recrudescence but with new area of possible infarct on CT ?Appreciate neurology consult from the ED with recommendations as follows: ?Recommendations: ?1. MRI brain with MRA head and neck (all without contrast due to impaired renal function) ?2. Will need TEE given new stroke symptoms despite normal recent TTE at [facility].  ?3. Cardiac telemetry ?4. Continue ASA 81 mg po qd and add Plavix 75 mg po qd (has failed ASA monotherapy).  ?5. Atorvastatin 40 mg po qd. Obtain a baseline CK level.  ?6. Modified permissive HTN protocol given her advanced age. Treat SBP if > 180.  ?7. PT/OT/Speech ?8. Glycemic control ?9. HgbA1c, fasting lipid panel ?10. Frequent neuro checks ?11. NPO until passes stroke swallow screen ? ?Acute renal failure superimposed on stage 4 chronic kidney disease ([place]) ?Creatinine 3.54, up from 2.48 on 01/28/2022 ?Avoid nephrotoxins and monitor renal function ?We will hold off on IV hydration and monitor ? ?Hyperlipidemia ?Continue atorvastatin 80 ? ?Type 2 diabetes mellitus with chronic kidney disease, without long-term current use of insulin ([place]) ?Recent A1c on 01/28/2022 of 10.6.  Patient was previously on insulin but documentation from [facility] states patient declined ?Sliding scale insulin while n.p.o. pending swallow eval ? ?Anemia of chronic kidney failure, stage 4 (severe) (HCC) ?Stable from recent past hospitalization. ? ?Diabetic polyneuropathy  ([place]) ?Hold gabapentin due to AKI ? ?Glaucoma ?Continue home eyedrops ? ?Malignant neoplasm of overlapping sites of right breast in female, estrogen receptor negative ([place]) ?No acute issues suspected ? ?Essential hypertension ?Permissive hypertension to systolic 322 per recommendations of nephrology so holding home antihypertensives ? ? ? ? ? ? ?Advance Care Planning:   Code Status: Not on file  ? ?Consults: Neurology, Dr. Cheral Marker ? ?Family Communication: none ? ?Severity of Illness: ?The appropriate patient status for this patient is INPATIENT. Inpatient status is judged to be reasonable and necessary in order to provide the required intensity of service to ensure the patient's safety. The patient's presenting symptoms, physical exam findings, and initial radiographic and laboratory data in the context of their chronic comorbidities is felt to place them at high risk for further clinical deterioration. Furthermore, it is not anticipated that the patient will be medically stable for discharge from the hospital within 2 midnights of admission.  ? ?*  I certify that at the point of admission it is my clinical judgment that the patient will require inpatient hospital care spanning beyond 2 midnights from the point of admission due to high intensity of service, high risk for further deterioration and high frequency of surveillance required.* ? ?Author: ?Athena Masse, MD ?02/09/2022 8:09 PM ? ?For on call review [email or web address].  ?

## 2022-02-09 NOTE — ED Notes (Signed)
Pt to MRI at this time.

## 2022-02-09 NOTE — Assessment & Plan Note (Addendum)
Recent A1c on 01/28/2022 of 10.6.  ?Patient was previously on insulin but [facility] documentation says patient has declined using insulin. ?Continue regular insulin sliding scale. ?

## 2022-02-09 NOTE — Assessment & Plan Note (Signed)
Continue atorvastatin 80

## 2022-02-09 NOTE — Consult Note (Addendum)
?                    NEURO HOSPITALIST CONSULT NOTE  ? ?Requestig physician: Dr. Starleen Blue ? ?Reason for Consult: New onset of left facial droop ? ?History obtained from:  Patient and Chart    ? ?HPI:                                                                                                                                         ? [name] is an 76 y.o. female with a PMHx of DM2, HTN, CKD, HLD, history of breast cancer and right MCA stroke one week ago for which she was evaluated and treated at [facility], who presents to the ED with new onset of left facial droop. Per family, she has been doing relatively well since her stroke, undergoing physical therapy and newly placed on ASA. Today she was last seen at her new post-stroke baseline by family at 26 AM. Family left her home and on returning at 3:30 PM, noted her to have a left facial droop. Per report, Speech Therapy had been working with her and had noted a left sided facial droop at 3:00 PM, but it is unclear if she was ever seen to be completely at her baseline with them prior to that.  ? ?Her stroke one week ago was associated with left sided weakness, but no facial droop per family. Symptoms with her recent stroke had started on 4/3. CT head without contrast obtained as part of the work up for her recent stroke demonstrated an acute/subacute right MCA territory infarct at that time. Per note from [facility], on admission there her exam was significant for left upper extremity, left lower extremity, and left facial weakness. Her TTE and PVL were unremarkable. EKG was within normal limits without concern for an arrhythmia. She had persistent LUE and LLE weakness and was scheduled to continue working with homehealth PT/OT. She was discharged on asprin 81 mg as well as atorvastatin.  ? 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  ? ? ?Past Medical History:  ?Diagnosis Date  ? Diabetes mellitus without complication ([place])   ? Hypertension    ? ? ?Past Surgical History:  ?Procedure Laterality Date  ? BREAST SURGERY    ? ? ?No family history on file.         ? ?Social History:  reports that she has quit smoking. She does not have any smokeless tobacco history on file. She reports that she does not drink alcohol. No history on file for drug use. ? ?Allergies  ?Allergen Reactions  ? Lyrica [Pregabalin] Other (See Comments)  ?  Suicidal/homocidal ideation.  ? ? ?HOME MEDICATIONS:                                                                                                                     ? ?  No current facility-administered medications on file prior to encounter.  ? ?Current Outpatient Medications on File Prior to Encounter  ?Medication Sig Dispense Refill  ? amLODipine (NORVASC) 2.5 MG tablet Take 2.5 mg by mouth daily.    ? buPROPion (WELLBUTRIN) 75 MG tablet Take 75 mg by mouth 2 (two) times daily.    ? gabapentin (NEURONTIN) 300 MG capsule Take 300 mg by mouth 3 (three) times daily.    ? insulin glargine (LANTUS) 100 UNIT/ML injection Inject into the skin at bedtime.    ? metFORMIN (GLUMETZA) 500 MG (MOD) 24 hr tablet Take 500 mg by mouth daily with breakfast.    ? pantoprazole (PROTONIX) 20 MG tablet Take 1 tablet (20 mg total) by mouth daily. 30 tablet 0  ? traMADol (ULTRAM) 50 MG tablet Take by mouth every 6 (six) hours as needed.    ? ? ? ?ROS:                                                                                                                                       ?As per HPI. She denies speech difficulty. She has sparse, hypophonic speech and is a poor historian, limiting ability to provide a comprehensive ROS.  ? ? ?Blood pressure 117/66, pulse 72, temperature 97.6 ?F (36.4 ?C), temperature source Oral, resp. rate 16, SpO2 100 %. ? ? ?General Examination:                                                                                                      ? ?Physical Exam  ?HEENT-  [place]/AT   ?Lungs- Respirations  unlabored ?Extremities- No edema ? ?Neurological Examination ?Mental Status: Awake and alert but with eyes closed due to blindness. Poor attention. Speech is fluent with intact comprehension for all commands and questions. Oriented to the city, state and day of the week, but not the month or year.  ?Cranial Nerves: ?II: Blind bilaterally. Right cornea opacified. Left pupil irregular and unreactive.  ?III,IV, VI: Has difficulty gazing to the right and left due to chronic blindness. Eyes are conjugate and move volitionally slightly from the midline to the left and right. No nystagmus.  ?V: Temp sensation is decreased on the left.  ?VII: Left facial droop, lower quadrant.  ?VIII: Hearing intact to voice ?IX,X: No hypophonia or hoarseness ?XI: Head is midline with grossly symmetric shoulder shrug ?XII: Midline tongue extension ?Motor: ?RUE and RLE 5/5 proximally and distally with no drift ?LUE 4+/5 with slight drift ?  LLE 4+/5 with slight drift ?Sensory: Temp and light touch intact to BUE and BLE without asymmetry ?Deep Tendon Reflexes: 1-2+ in upper and lower extremities with slight right-left asymmetry ?Plantars: Right: downgoing   Left: downgoing ?Cerebellar: No ataxia with finger-to-belly (blind). No ataxia with H-S bilaterally ?Gait: Deferred due to blindness ?  ?Lab Results: ?Basic Metabolic Panel: ?No results for input(s): NA, K, CL, CO2, GLUCOSE, BUN, CREATININE, CALCIUM, MG, PHOS in the last 168 hours. ? ?CBC: ?No results for input(s): WBC, NEUTROABS, HGB, HCT, MCV, PLT in the last 168 hours. ? ?Cardiac Enzymes: ?No results for input(s): CKTOTAL, CKMB, CKMBINDEX, TROPONINI in the last 168 hours. ? ?Lipid Panel: ?No results for input(s): CHOL, TRIG, HDL, CHOLHDL, VLDL, LDLCALC in the last 168 hours. ? ?Imaging: ?CT head personally reviewed. See Assessment below for details.  ? ? ?Assessment: 76 y.o. female with a PMHx of DM2, HTN, CKD, HLD, history of breast cancer and right MCA stroke one week ago for which she  was evaluated and treated at [facility], who presents to the ED with new onset of left facial droop.  ?1. LKN was most likely 0900. There is some chance of LKN at 3:00 PM although history provided is variable. If LKN was at 3:00 PM, she would be barely within the TNK time window; however, given her recent stroke, TNK is contraindicated.  ?2. Exam findings are not consistent with LVO ?3. Stroke risk factors: As above.  ?4. CT head: Hypodensity in the right frontal lobe involving the precentral gyrus is new since 2016 and could reflect acute infarct. Additional hypodensity in the left thalamic capsular region is new since 2016, consistent with age-indeterminate but possibly acute infarct. ASPECTS is 9 ?No acute intracranial hemorrhage. Encephalomalacia in the left anterior temporal lobe consistent with sequela of prior infarct or trauma, increased in conspicuity since 2016, though this may be due to artifact on the prior study. ? ? ?Recommendations: ?1. MRI brain with MRA head and neck (all without contrast due to impaired renal function) ?2. Will need TEE given new stroke symptoms despite [facility] recent T[facility] at [facility].  ?3. Cardiac telemetry ?4. Continue ASA 81 mg po qd and add Plavix 75 mg po qd (has failed ASA monotherapy).  ?5. Atorvastatin 40 mg po qd. Obtain a baseline CK level.  ?6. Modified permissive HTN protocol given her advanced age. Treat SBP if > 180.  ?7. PT/OT/Speech ?8. Glycemic control ?9. HgbA1c, fasting lipid panel ?10. Frequent neuro checks ?11. NPO until passes stroke swallow screen ? ?Addendum: ?- MRI brain: Small acute infarct of the medial ventral left thalamus. No hemorrhage or mass effect. Area of combined cortical and subcortical acute ischemia within the right MCA territory. ?- MRA head: Short segment occlusion of the right M2 proximal superior division. Severe stenosis of the distal right M1 segment. Multifocal occlusion of the left PCA. ?- Normal MRA of the neck. ?- Continue current management with  ASA and Plavix ? ?Electronically signed: Dr. Kerney Elbe ?02/09/2022, 5:18 PM ? ? ?   ?

## 2022-02-09 NOTE — Assessment & Plan Note (Addendum)
Patient with recent CVA on 01/28/22 with residual left hemiparesis presenting with left facial droop and possible new stroke . CT head shows new area of infarct. ?Patient assessed by neurologist Dr. Cheral Marker who recommended MRA / MRI ?MRI brain confirms small acute infarct of the ventral left thalamus, no hemorrhage or mass effect. ?MRA head shows no large vessel occlusion. ?Recommended TEE given new stroke symptoms despite normal TTE at [facility] ?Continue aspirin and Plavix (she failed aspirin monotherapy) ?Continue atorvastatin 80 mg daily. ?Allow permissive hypertension given her advanced age SBP if above 180 ?PT/OT/speech evaluation. ? ?

## 2022-02-09 NOTE — Assessment & Plan Note (Signed)
Stable from recent past hospitalization. ?

## 2022-02-09 NOTE — ED Notes (Signed)
Activated Code Stroke to [place], [place] ?

## 2022-02-09 NOTE — ED Provider Notes (Signed)
? ?[facility] ?Provider Note ? ? ? Event Date/Time  ? First MD Initiated Contact with Patient 02/09/22 1710   ?  (approximate) ? ? ?History  ? ?Facial Droop ? ? ?HPI ? ?[name] is a 76 y.o. female with past medical history of CVA, diabetes, hypertension, CKD who presents with left-sided facial droop.  Patient is accompanied by family numbers who notes that when she woke up today around 9 her face looked normal.  Then around 3 PM when she was working with speech therapy they noticed a left-sided facial droop.  Patient denies any other significant symptoms.  Patient was recently admitted to [facility] about a week ago for right MCA stroke.  The following is obtained from the discharge summary from that admission ? ?Ischemic stroke in the Right MCA territory: Primary stroke risk factors are diabetes mellitus, hypertension, hyperlipidemia, medication non-compliance and hypercoagulable state (history of Breast Cancer). Patient's symptoms initiated 4/3. Her CT head without contrast in the OSH emergency department demonstrated acute/subacute right MCA territory infarct. She was transferred to [facility] [place] for stroke etiology work-up and physical/occupational therapy. On admission, her exam was significant for left upper extremity, left lower extremity, and left facial weakness. She underwent work-up for causes of stroke. Her TTE and PVL were unremarkable. EKG was within normal limits without concern for an arrhythmia. She had persistent LUE and LLE weakness and will continue working with homehealth PT/OT. She will be discharged on asprin '81mg'$ . The patient reports that she prefer to return home with PT vs going to a skilled nursing facility at this time.  ?  ?Patient was discharged on aspirin and atorvastatin.  She had carotid ultrasound that showed 40% stenosis of the right carotid ICA no hemodynamically significant plaque on the left. ? ?Past Medical History:  ?Diagnosis Date  ? Diabetes mellitus  without complication ([place])   ? Hypertension   ? ? ?There are no problems to display for this patient. ? ? ? ?Physical Exam  ?Triage Vital Signs: ?ED Triage Vitals  ?Enc Vitals Group  ?   BP 02/09/22 1657 117/66  ?   Pulse Rate 02/09/22 1657 72  ?   Resp 02/09/22 1657 16  ?   Temp 02/09/22 1657 97.6 ?F (36.4 ?C)  ?   Temp Source 02/09/22 1657 Oral  ?   SpO2 02/09/22 1657 100 %  ?   Weight 02/09/22 1726 115 lb 4.8 oz (52.3 kg)  ?   Height 02/09/22 1726 5' (1.524 m)  ?   Head Circumference --   ?   Peak Flow --   ?   Pain Score 02/09/22 1704 0  ?   Pain Loc --   ?   Pain Edu? --   ?   Excl. in [place]? --   ? ? ?Most recent vital signs: ?Vitals:  ? 02/09/22 1739 02/09/22 1800  ?BP:  124/65  ?Pulse: 66 65  ?Resp: 20 12  ?Temp:    ?SpO2: 100% 100%  ? ? ? ?General: Awake,  ?CV:  Good peripheral perfusion.  ?Resp:  Normal effort.  ?Abd:  No distention.  ?Neuro:             Awake, Alert, Orientiented x 1 ? ?Other:  + L sided facial droop ? Mild L sided pronator drift  ? 5/5 strength in BL lower extremities  ? FNF intact  ? Pt is blind  ? ? ?ED Results / Procedures / Treatments  ?Labs ?(all labs  ordered are listed, but only abnormal results are displayed) ?Labs Reviewed  ?APTT - Abnormal; Notable for the following components:  ?    Result Value  ? aPTT 38 (*)   ? All other components within normal limits  ?CBC - Abnormal; Notable for the following components:  ? RBC 3.04 (*)   ? Hemoglobin 8.7 (*)   ? HCT 28.4 (*)   ? Platelets 111 (*)   ? All other components within normal limits  ?COMPREHENSIVE METABOLIC PANEL - Abnormal; Notable for the following components:  ? CO2 21 (*)   ? Glucose, Bld 187 (*)   ? BUN 55 (*)   ? Creatinine, Ser 3.54 (*)   ? AST 11 (*)   ? Total Bilirubin 0.2 (*)   ? GFR, Estimated 13 (*)   ? All other components within normal limits  ?CBG MONITORING, ED - Abnormal; Notable for the following components:  ? Glucose-Capillary 190 (*)   ? All other components within normal limits  ?CBG MONITORING, ED -  Abnormal; Notable for the following components:  ? Glucose-Capillary 173 (*)   ? All other components within normal limits  ?PROTIME-INR  ?DIFFERENTIAL  ?URINALYSIS, ROUTINE W REFLEX MICROSCOPIC  ? ? ? ?EKG ? ?Normal sinus rhythm with left anterior fascicular block, no acute ischemic change ? ? ?RADIOLOGY ?I reviewed the CT head which shows hypodensity in the right frontal lobe and left thalamus new from 2016 ? ? ?PROCEDURES: ? ?Critical Care performed: No ? ?.1-3 Lead EKG Interpretation ?Performed by: Rada Hay, MD ?Authorized by: Rada Hay, MD  ? ?  Interpretation: normal   ?  ECG rate assessment: normal   ?  Rhythm: sinus rhythm   ?  Ectopy: none   ?  Conduction: normal   ? ?The patient is on the cardiac monitor to evaluate for evidence of arrhythmia and/or significant heart rate changes. ? ? ?MEDICATIONS ORDERED IN ED: ?Medications  ?aspirin chewable tablet 324 mg (has no administration in time range)  ?sodium chloride 0.9 % bolus 1,000 mL (has no administration in time range)  ?sodium chloride flush (NS) 0.9 % injection 3 mL (3 mLs Intravenous Given 02/09/22 1730)  ? ? ? ?IMPRESSION / MDM / ASSESSMENT AND PLAN / ED COURSE  ?I reviewed the triage vital signs and the nursing notes. ?             ?               ? ?Differential diagnosis includes, but is not limited to, new CVA, recrudescence, UTI, PNA  ? ? ?Patient is a 76 year old female with recent MCA stroke diagnosed at [facility] with left-sided weakness presents with new left-sided facial droop.  Per family at bedside around 9 AM today she was seen with normal face symmetry and then at 3 PM they noticed left-sided facial droop.  Patient also little bit more somnolent than normal.  On review of records from [facility] patient did prior have a left-sided facial droop as well as left upper and left lower extremity weakness.  On arrival to the ED stroke alert was called given concern for new neurologic symptoms.  Dr. Cheral Marker at bedside evaluating the  patient.  She does have some mild left upper extremity pronator drift and a left-sided facial droop.  Family is noting that this is new since 3 PM today but last known normal was at 9 AM.  With recent CVA patient is not a tPA candidate.  CT  head here shows right-sided frontal lobe hypodensity as well as left thalamic hypodensity.  These are new from 2016 but we do not have prior imaging to compare to more recently.  Dr. Cheral Marker had initially wanted a CTA however because patient's creatinine is poor we will defer this and obtain an MRI and MRA.  Per neurology would prefer inpatient work-up with MRI/MRA.  Reviewed her most recent admission and carotid ultrasound showed right-sided 40% ICA stenosis no other critical stenosis and TTE was normal.  Unclear if this represents a new stroke MRI will give [place] further details.  Could also be recrudescence so we will obtain a chest x-ray and urine.  Patient's hemoglobin is 8.7 and on review of prior this is stable.  Creatinine is more elevated today at 3.5 from 2.8.  We will give her a liter of fluid.  She otherwise has no new symptoms.  Will admit to the hospital service. ? ? ?FINAL CLINICAL IMPRESSION(S) / ED DIAGNOSES  ? ?Final diagnoses:  ?Facial droop  ? ? ? ?Rx / DC Orders  ? ?ED Discharge Orders   ? ? None  ? ?  ? ? ? ?Note:  This document was prepared using Dragon voice recognition software and may include unintentional dictation errors. ?  ?Rada Hay, MD ?02/09/22 1923 ? ?

## 2022-02-10 DIAGNOSIS — R29810 Facial weakness: Secondary | ICD-10-CM | POA: Diagnosis not present

## 2022-02-10 LAB — GLUCOSE, CAPILLARY
Glucose-Capillary: 187 mg/dL — ABNORMAL HIGH (ref 70–99)
Glucose-Capillary: 116 mg/dL — ABNORMAL HIGH (ref 70–99)
Glucose-Capillary: 212 mg/dL — ABNORMAL HIGH (ref 70–99)
Glucose-Capillary: 166 mg/dL — ABNORMAL HIGH (ref 70–99)
Glucose-Capillary: 176 mg/dL — ABNORMAL HIGH (ref 70–99)

## 2022-02-10 LAB — HEMOGLOBIN A1C
Hgb A1c MFr Bld: 9.9 % — ABNORMAL HIGH (ref 4.8–5.6)
Mean Plasma Glucose: 237.43 mg/dL

## 2022-02-10 MED ORDER — HYDRALAZINE HCL 20 MG/ML IJ SOLN
5.0000 mg | Freq: Once | INTRAMUSCULAR | Status: AC
  Administered 2022-02-10: 5 mg via INTRAVENOUS
  Filled 2022-02-10: qty 1

## 2022-02-10 NOTE — Evaluation (Signed)
Occupational Therapy Evaluation ?Patient Details ?Name: [name] ?MRN: [id number] ?DOB: [birth year] ?Today's Date: 02/10/2022 ? ? ?History of Present Illness 76 y.o. female with medical history significant for type 2 diabetes, HTN, HLD, diabetic neuropathy, glaucoma, CKD 4, recent right MCA stroke (hospitalized at [facility] from 4/5 to 02/02/2022 now with residual left upper and lower extremity weakness and left facial weakness. Pt was discharged home with home health). Pt presented to [facility] with concerns for worsening of her left facial droop. MRI brain revealed small acute infarct of the medial ventral left  thalamus and an area of combined cortical and subcortical  acute ischemia within the right MCA territory. No acute or chronic hemorrhage.  ? ?Clinical Impression ?  ?Pt seen for OT evaluation this date. Upon arrival to room, pt awake and seated upright in bed. Pt A&Ox2, reporting no pain, and agreeable to OT tx. Pt's spouse called to confirm home set-up and PLOF provided by pt. Prior to admission, pt was independent in all ADLs and functional mobility (using SPC for community mobility), living in a 1-story home with spouse and grand-daughter. Pt currently requires SUPERVISION for bed mobility, MIN A to navigate environmental obstacles during functional mobility of short household distances (~85f), MIN GUARD for toilet transfers/hygiene, and SUPERVISION for standing hand hygiene d/t impaired vision (pt has glaucoma at baseline), decreased balance, and decreased strength. Pt would benefit from additional skilled OT services to maximize return to PLOF and minimize risk of future falls, injury, caregiver burden, and readmission. Upon discharge, recommend [facility].     ?   ? ?Recommendations for follow up therapy are one component of a multi-disciplinary discharge planning process, led by the attending physician.  Recommendations may be updated based on patient status, additional functional criteria and insurance  authorization.  ? ?Follow Up Recommendations ? Home health OT  ?  ?Assistance Recommended at Discharge Intermittent Supervision/Assistance  ?Patient can return home with the following A little help with walking and/or transfers;A little help with bathing/dressing/bathroom;Assistance with cooking/housework;Assist for transportation ? ?  ?Functional Status Assessment ? Patient has had a recent decline in their functional status and demonstrates the ability to make significant improvements in function in a reasonable and predictable amount of time.  ?Equipment Recommendations ? None recommended by OT  ?  ?   ?Precautions / Restrictions Precautions ?Precautions: Fall ?Restrictions ?Weight Bearing Restrictions: No  ? ?  ? ?Mobility Bed Mobility ?Overal bed mobility: Needs Assistance ?Bed Mobility: Supine to Sit ?  ?  ?Supine to sit: Supervision, HOB elevated ?  ?  ?  ?  ? ?Transfers ?Overall transfer level: Needs assistance ?Equipment used: Rolling walker (2 wheels) ?Transfers: Sit to/from Stand ?Sit to Stand: Min guard ?  ?  ?  ?  ?  ?  ?  ? ?  ?Balance Overall balance assessment: Needs assistance ?Sitting-balance support: No upper extremity supported, Feet supported ?Sitting balance-Leahy Scale: Good ?Sitting balance - Comments: Good sitting balance reaching within BOS ?  ?Standing balance support: No upper extremity supported, During functional activity ?Standing balance-Leahy Scale: Fair ?Standing balance comment: Requires only SUPERVISION for standing hand hygiene. During functional mobility, requires MIN A to navigate RW around environmental obstacles d/t baseline visual impairments, otherwise pt able to maintain dynamic standing balance with MIN GUARD ?  ?  ?  ?  ?  ?  ?  ?  ?  ?  ?  ?   ? ?ADL either performed or assessed with clinical judgement  ? ?  ADL Overall ADL's : Needs assistance/impaired ?  ?  ?Grooming: Wash/dry hands;Supervision/safety;Standing ?Grooming Details (indicate cue type and reason): Requires  tactile cues to locate soap and washcloth ?  ?  ?  ?  ?  ?  ?  ?  ?Toilet Transfer: Min guard;Ambulation;Regular Toilet;Rolling walker (2 wheels);Grab bars ?Toilet Transfer Details (indicate cue type and reason): Requires verbal and tactile cues to locate toilet and grab bar ?Toileting- Clothing Manipulation and Hygiene: Supervision/safety;Sit to/from stand ?Toileting - Clothing Manipulation Details (indicate cue type and reason): Requires SUPERVISION to don/doff underwear and perform peri-care ?  ?  ?Functional mobility during ADLs: Minimal assistance;Rolling walker (2 wheels) (Requires MIN A to navigate RW around environmental obstacles. Able to maintain dynamic standing balance with MIN GUARD) ?   ? ? ? ?Vision Baseline Vision/History: 3 Glaucoma ?Ability to See in Adequate Light: 3 Highly impaired ?Patient Visual Report: No change from baseline ?   ?   ?   ?   ? ?Pertinent Vitals/Pain Pain Assessment ?Pain Assessment: No/denies pain  ? ? ? ?Hand Dominance Right ?  ?Extremity/Trunk Assessment Upper Extremity Assessment ?Upper Extremity Assessment: RUE deficits/detail ?RUE Deficits / Details: Grossly 4+/5 in all movements ?RUE Sensation: WNL ?  ?Lower Extremity Assessment ?Lower Extremity Assessment: Overall WFL for tasks assessed;Defer to PT evaluation ?  ?  ?  ?Communication Communication ?Communication: No difficulties ?  ?Cognition Arousal/Alertness: Awake/alert ?Behavior During Therapy: [facility] for tasks assessed/performed ?Overall Cognitive Status: No family/caregiver present to determine baseline cognitive functioning ?  ?  ?  ?  ?  ?  ?  ?  ?  ?  ?  ?  ?  ?  ?  ?  ?General Comments: A&Ox2 ?  ?  ?   ?   ?   ? ? ?Home Living Family/patient expects to be discharged to:: Private residence ?Living Arrangements: Spouse/significant other;Other (Comment) (granddaughter) ?Available Help at Discharge: Family;Available 24 hours/day ?Type of Home: House ?Home Access: Level entry ?  ?  ?Home Layout: One level ?  ?   ?Bathroom Shower/Tub: Tub/shower unit ?  ?Bathroom Toilet: Standard ?  ?  ?Home Equipment: Tub bench;Cane - single point ?  ?Additional Comments: Pt is a questionable historian. Pt's spouse called and confirmed PLOF/home set-up ?  ? ?  ?Prior Functioning/Environment Prior Level of Function : Independent/Modified Independent ?  ?  ?  ?  ?  ?  ?Mobility Comments: Pt does not use AD for household ambulation. Spouse reports that she uses [facility] for community mobility. Just started [facility] ?ADLs Comments: Pt reports that at baseline she is independent with ADLs. Family provides transportation ?  ? ?  ?  ?OT Problem List: Impaired balance (sitting and/or standing);Impaired vision/perception;Decreased strength ?  ?   ?OT Treatment/Interventions: Self-care/ADL training;Therapeutic exercise;Neuromuscular education;DME and/or AE instruction;Therapeutic activities;Patient/family education;Balance training;Visual/perceptual remediation/compensation  ?  ?OT Goals(Current goals can be found in the care plan section) Acute Rehab OT Goals ?Patient Stated Goal: to return home ?OT Goal Formulation: With patient ?Time For Goal Achievement: 02/24/22 ?Potential to Achieve Goals: Good ?ADL Goals ?Pt Will Perform Grooming: with modified independence;standing ?Pt Will Perform Lower Body Dressing: with modified independence;sit to/from stand ?Pt Will Transfer to Toilet: with modified independence  ?OT Frequency: Min 2X/week ?  ? ?   ?AM-PAC OT "6 Clicks" Daily Activity     ?Outcome Measure Help from another person eating meals?: None ?Help from another person taking care of personal grooming?: A Little ?Help from another person toileting,  which includes using toliet, bedpan, or urinal?: A Little ?Help from another person bathing (including washing, rinsing, drying)?: A Little ?Help from another person to put on and taking off regular upper body clothing?: None ?Help from another person to put on and taking off regular lower body clothing?: A  Little ?6 Click Score: 20 ?  ?End of Session Equipment Utilized During Treatment: Gait belt;Rolling walker (2 wheels) ?Nurse Communication: Mobility status ? ?Activity Tolerance: Patient tolerated treatment well ?Fraser Din

## 2022-02-10 NOTE — Progress Notes (Signed)
?Progress Note ? ? ?Patient: [name] FBX:[id number] DOB: 1946/03/22 DOA: 02/09/2022     1 ? ?DOS: the patient was seen and examined on 02/10/2022 ?  ?Brief hospital course: ?This 76 years old female with PMH significant for type 2 diabetes, hypertension, hyperlipidemia, ER negative breast cancer, diabetic neuropathy, glaucoma, CKD stage IV, recent right MCA stroke for which she was hospitalized at [facility] from 01/28/2022 - 02/02/2022 with residual left upper and lower extremity weakness and left facial weakness.  She was discharged to SNF for rehab, Patient was sent from facility with concern for worsening of her left facial droop.  She came as a stroke code.  Assessed by neurologist Dr. Cheral Marker who recommended MRI/MRA and additional stroke work-up.  Imaging confirms new acute infarcts.  Neurologist recommended TEE to rule out cardiac source of strokes.  Patient is admitted for CVA. ? ?Assessment and Plan: ?* Acute CVA (cerebrovascular accident), recurrent(HCC) ?Patient with recent CVA on 01/28/22 with residual left hemiparesis presenting with left facial droop and possible new stroke . CT head shows new area of infarct. ?Patient assessed by neurologist Dr. Cheral Marker who recommended MRA / MRI ?MRI brain confirms small acute infarct of the ventral left thalamus, no hemorrhage or mass effect. ?MRA head shows no large vessel occlusion. ?Recommended TEE given new stroke symptoms despite normal TTE at [facility] ?Continue aspirin and Plavix (she failed aspirin monotherapy) ?Continue atorvastatin 80 mg daily. ?Allow permissive hypertension given her advanced age SBP if above 180 ?PT/OT/speech evaluation. ? ? ?Acute renal failure superimposed on stage 4 chronic kidney disease ([place]) ?Serum creatinine at admission 3.54, which is up from her baseline 2.48 ?Avoid nephrotoxic medications, monitor renal functions. ?Gentle IV hydration. ? ? ?Hyperlipidemia ?Continue atorvastatin 80 ? ?Type 2 diabetes mellitus with chronic kidney disease,  without long-term current use of insulin ([place]) ?Recent A1c on 01/28/2022 of 10.6.  ?Patient was previously on insulin but [facility] documentation says patient has declined using insulin. ?Continue regular insulin sliding scale. ? ?Anemia of chronic kidney failure, stage 4 (severe) (HCC) ?Stable from recent past hospitalization. ? ?Diabetic polyneuropathy ([place]) ?Hold gabapentin due to AKI ? ?Glaucoma ?Continue home eyedrops ? ?Malignant neoplasm of overlapping sites of right breast in female, estrogen receptor negative ([place]) ?No acute issues suspected. ? ?Essential hypertension ?Allow permissive hypertension in the setting of  CVA. ?Treat if SBP above 180 ? ? ? ?Subjective: Patient was seen and examined at bedside.  Overnight events noted. ?Patient was participating in physical therapy,  states she is doing better and want to be discharged. ? ?Physical Exam: ?Vitals:  ? 02/10/22 0457 02/10/22 0622 02/10/22 0820 02/10/22 1215  ?BP: (!) 133/50 117/76 123/62 130/69  ?Pulse:    68  ?Resp:  '15 13 16  '$ ?Temp:  97.9 ?F (36.6 ?C) 98 ?F (36.7 ?C) 97.6 ?F (36.4 ?C)  ?TempSrc:  Oral Oral   ?SpO2:  100% 100% 100%  ?Weight:      ?Height:      ? ?General exam: Appears comfortable, not in any acute distress.  Deconditioned ?Respiratory system: CTA bilaterally, no wheezing, no crackles, normal respiratory effort. ?Cardiovascular system: S1-S2 heard, regular rate and rhythm, murmur+ ?Gastrointestinal system: Abdominal soft, nontender, nondistended, BS+ ?Central nervous system: Alert, oriented x2, left-sided facial droop, left-sided weakness noted ?Extremities: No edema, no cyanosis, no clubbing ?Psychiatry: Mood, Insight judgment appropriate. ? ? ?Data Reviewed: ?I have Reviewed nursing notes, Vitals, and Lab results since pt's last encounter. Pertinent lab results CBC, BMP ?I have ordered test including  CBC, BMP, mag, Phos ?I have independently visualized and interpreted imaging CT head, MRI/MRA which showed new stroke in the ventral  thalamus. ?I have reviewed the last note from neurologist,  ?I have discussed pt's care plan and test results with patient.  ? ?Family Communication: No family at bedside ? ?Disposition: ?Status is: Inpatient ?Remains inpatient appropriate because:  ?Patient with history of CVA with residual left-sided weakness presented with new onset of stroke.  Patient requires stroke work-up.  Patient needs TEE to rule out embolic source. ? ? ? Planned Discharge Destination: Skilled nursing facility ? ? ? ?Time spent: 50 minutes ? ?Author: ?Shawna Clamp, MD ?02/10/2022 1:58 PM ? ?For on call review [email or web address].  ?

## 2022-02-10 NOTE — Progress Notes (Signed)
?   02/10/22 0430  ?Assess: MEWS Score  ?Temp (!) 97.4 ?F (36.3 ?C)  ?BP (!) 206/102  ?Pulse Rate 89  ?Resp 17  ?Level of Consciousness Alert  ?SpO2 100 %  ?O2 Device Room Air  ?Patient Activity (if Appropriate) In bed  ?Assess: if the MEWS score is Yellow or Red  ?Were vital signs taken at a resting state? Yes  ?Focused Assessment No change from prior assessment  ?Does the patient meet 2 or more of the SIRS criteria? No  ?MEWS guidelines implemented *See Row Information* Yes  ?Treat  ?MEWS Interventions Administered prn meds/treatments ?(Administered [place]. Per NP)  ?Pain Scale 0-10  ?Pain Score 0  ?Take Vital Signs  ?Increase Vital Sign Frequency  Yellow: Q 2hr X 2 then Q 4hr X 2, if remains yellow, continue Q 4hrs  ?Escalate  ?MEWS: Escalate Yellow: discuss with charge nurse/RN and consider discussing with provider and RRT  ?Notify: Charge Nurse/RN  ?Name of Charge Nurse/RN Notified Edwin Dada RN  ?Date Charge Nurse/RN Notified 02/10/22  ?Time Charge Nurse/RN Notified 0430  ?Notify: Provider  ?Provider Name/Title Neomia Glass, NP  ?Date Provider Notified 02/10/22  ?Time Provider Notified [phone]  ?Notification Type Page  ?Notification Reason Other (Comment) ?(Yellow Mews and elevated BP)  ?Provider response See new orders  ?Assess: SIRS CRITERIA  ?SIRS Temperature  0  ?SIRS Pulse 0  ?SIRS Respirations  0  ?SIRS WBC 0  ?SIRS Score Sum  0  ? ? ?

## 2022-02-10 NOTE — Progress Notes (Addendum)
SLP Cancellation Note ? ?Patient Details ?Name: [name] ?MRN: [id number] ?DOB: 03-29-1946 ? ? ?Cancelled treatment:       Reason Eval/Treat Not Completed:  (chart reviewed; pt screened at bedside. Daughter present. No immediate, Acute needs indicated.) ?Pt currently resting in bed; Dtr present. Pt did awaken to talk on the phone w/ friend/family.  ?Pt and Dtr denied any difficulty swallowing; noted Lunch tray completely consumed including salad. Pt is on a Regular diet; tolerates swallowing pills w/ water per NSG. No dysphagia concerns. ?Pt responded in general conversation and spoke on the phone w/out overt expressive/receptive deficits noted; pt and Dtr denied any speech-language deficits. Noted low volume of speech; speech intelligible and clear. Noted a slight asymmetry in Left facial/lower labial tone but this did not overly impact intelligibility nor bolus management during oral intake.  ?Pt is receiving ST services via home health to address L facial weakness from recent CVA ~1 week ago(per chart notes) -- 1st visit yesterday prior to this admit. Recommend continuing the home health ST POC at discharge.  ?Handout given to pt/Dtr on general articulation recommendations to be aware of until therapy resumes.  ?No Acute ST services indicated currently as pt appears at her previous baseline (prior to this admit) -- she has ongoing ST f/u in the home per Dtr, and this is recommended to continue at discharge. Pt/Dtr agreed. NSG to reconsult if any change in status while admitted.   ? ? ? ? ? ?Orinda Kenner, MS, CCC-SLP ?Speech Language Pathologist ?Rehab Services; [place] ?[phone] (ascom) ?Ritika Hellickson ?02/10/2022, 2:49 PM ?

## 2022-02-10 NOTE — Hospital Course (Signed)
This 76 years old female with PMH significant for type 2 diabetes, hypertension, hyperlipidemia, ER negative breast cancer, diabetic neuropathy, glaucoma, CKD stage IV, recent right MCA stroke for which she was hospitalized at [facility] from 01/28/2022 - 02/02/2022 with residual left upper and lower extremity weakness and left facial weakness.  She was discharged to SNF for rehab, patient was sent from facility with concern for worsening of her left facial droop.  She came as a stroke code.  Assessed by neurologist Dr. Cheral Marker who recommended MRI/MRA and additional stroke work-up.  Imaging confirms new acute infarcts.  Neurologist recommended TEE to rule out cardiac source of strokes.  Patient is admitted for CVA. ?

## 2022-02-10 NOTE — Evaluation (Signed)
Physical Therapy Evaluation ?Patient Details ?Name: [name] ?MRN: [id number] ?DOB: 09-Dec-1945 ?Today's Date: 02/10/2022 ? ?History of Present Illness ? 76 y.o. female with medical history significant for type 2 diabetes, HTN, HLD, diabetic neuropathy, glaucoma, CKD 4, recent right MCA stroke (hospitalized at [facility] from 4/5 to 02/02/2022 now with residual left upper and lower extremity weakness and left facial weakness. Pt was discharged home with home health). Pt presented to [facility] with concerns for worsening of her left facial droop. MRI brain revealed small acute infarct of the medial ventral left  thalamus and an area of combined cortical and subcortical  acute ischemia within the right MCA territory. No acute or chronic hemorrhage.  ?Clinical Impression ? Pt is a pleasant 76 year old female who was admitted for acute CVA. Pt performs ambulation with RW, however needs cues and guidance for sequencing due to low vision. Strength appears at baseline level, no sensation deficits. Pt demonstrates deficits with vision and L facial droop. Will continue to progress in hospital to prevent deconditioning. Would benefit from skilled PT to address above deficits and promote optimal return to PLOF. Recommend transition to [place] upon discharge from acute hospitalization. ?  ?   ? ?Recommendations for follow up therapy are one component of a multi-disciplinary discharge planning process, led by the attending physician.  Recommendations may be updated based on patient status, additional functional criteria and insurance authorization. ? ?Follow Up Recommendations Home health PT ? ?  ?Assistance Recommended at Discharge Frequent or constant Supervision/Assistance  ?Patient can return home with the following ? A little help with walking and/or transfers;A little help with bathing/dressing/bathroom ? ?  ?Equipment Recommendations Rolling walker (2 wheels)  ?Recommendations for Other Services ?    ?  ?Functional Status Assessment  Patient has had a recent decline in their functional status and demonstrates the ability to make significant improvements in function in a reasonable and predictable amount of time.  ? ?  ?Precautions / Restrictions Precautions ?Precautions: Fall ?Restrictions ?Weight Bearing Restrictions: No  ? ?  ? ?Mobility ? Bed Mobility ?  ?  ?  ?  ?  ?  ?  ?General bed mobility comments: received in bathroom upon arrival ?  ? ?Transfers ?Overall transfer level: Needs assistance ?Equipment used: Rolling walker (2 wheels) ?Transfers: Sit to/from Stand ?Sit to Stand: Min guard ?  ?  ?  ?  ?  ?General transfer comment: cues for hand placement ?  ? ?Ambulation/Gait ?Ambulation/Gait assistance: Min guard ?Gait Distance (Feet): 20 Feet ?Assistive device: Rolling walker (2 wheels) ?Gait Pattern/deviations: Step-through pattern ?  ?  ?  ?General Gait Details: ambulated in room, further distance deferred due to pt wanting to eat her breakfast. Safe technique, however does require cues for obstacles due to low vision. Follows commands well ? ?Stairs ?  ?  ?  ?  ?  ? ?Wheelchair Mobility ?  ? ?Modified Rankin (Stroke Patients Only) ?  ? ?  ? ?Balance Overall balance assessment: Needs assistance ?Sitting-balance support: No upper extremity supported, Feet supported ?Sitting balance-Leahy Scale: Good ?  ?  ?Standing balance support: No upper extremity supported, During functional activity ?Standing balance-Leahy Scale: Fair ?  ?  ?  ?  ?  ?  ?  ?  ?  ?  ?  ?  ?   ? ? ? ?Pertinent Vitals/Pain Pain Assessment ?Pain Assessment: No/denies pain  ? ? ?Home Living Family/patient expects to be discharged to:: Private residence ?Living Arrangements: Spouse/significant  other;Other (Comment) ?Available Help at Discharge: Family;Available 24 hours/day ?Type of Home: House ?Home Access: Level entry ?  ?  ?  ?Home Layout: One level ?Home Equipment: Tub bench;Cane - single point ?Additional Comments: Pt is a questionable historian. OT called Pt's spouse  called and confirmed PLOF/home set-up. PT copied history from OT chart  ?  ?Prior Function Prior Level of Function : Independent/Modified Independent ?  ?  ?  ?  ?  ?  ?Mobility Comments: Pt does not use AD for household ambulation. Spouse reports that she uses [facility] for community mobility. Just started [facility] ?ADLs Comments: Pt reports that at baseline she is independent with ADLs. Family provides transportation ?  ? ? ?Hand Dominance  ?   ? ?  ?Extremity/Trunk Assessment  ? Upper Extremity Assessment ?Upper Extremity Assessment: Defer to OT evaluation ?RUE Deficits / Details: Grossly 4+/5 in all movements ?RUE Sensation: WNL ?  ? ?Lower Extremity Assessment ?Lower Extremity Assessment: Overall WFL for tasks assessed ?  ? ?   ?Communication  ? Communication: No difficulties  ?Cognition Arousal/Alertness: Awake/alert ?Behavior During Therapy: [facility] for tasks assessed/performed ?Overall Cognitive Status: No family/caregiver present to determine baseline cognitive functioning ?  ?  ?  ?  ?  ?  ?  ?  ?  ?  ?  ?  ?  ?  ?  ?  ?General Comments: A&Ox2 ?  ?  ? ?  ?General Comments   ? ?  ?Exercises    ? ?Assessment/Plan  ?  ?PT Assessment Patient needs continued PT services  ?PT Problem List Decreased strength;Decreased balance;Decreased mobility ? ?   ?  ?PT Treatment Interventions DME instruction;Gait training;Balance training   ? ?PT Goals (Current goals can be found in the Care Plan section)  ?Acute Rehab PT Goals ?Patient Stated Goal: to go home ?PT Goal Formulation: With patient ?Time For Goal Achievement: 02/24/22 ?Potential to Achieve Goals: Good ? ?  ?Frequency 7X/week ?  ? ? ?Co-evaluation   ?  ?  ?  ?  ? ? ?  ?AM-PAC PT "6 Clicks" Mobility  ?Outcome Measure Help needed turning from your back to your side while in a flat bed without using bedrails?: None ?Help needed moving from lying on your back to sitting on the side of a flat bed without using bedrails?: None ?Help needed moving to and from a bed to a chair  (including a wheelchair)?: A Little ?Help needed standing up from a chair using your arms (e.g., wheelchair or bedside chair)?: A Little ?Help needed to walk in hospital room?: A Little ?Help needed climbing 3-5 steps with a railing? : A Little ?6 Click Score: 20 ? ?  ?End of Session Equipment Utilized During Treatment: Gait belt ?Activity Tolerance: Patient tolerated treatment well ?Patient left: in chair;with chair alarm set ?Nurse Communication: Mobility status ?PT Visit Diagnosis: Unsteadiness on feet (R26.81);Difficulty in walking, not elsewhere classified (R26.2) ?  ? ?Time: 7414-2395 ?PT Time Calculation (min) (ACUTE ONLY): 13 min ? ? ?Charges:   PT Evaluation ?$PT Eval Low Complexity: 1 Low ?  ?  ?   ? ? ?Greggory Stallion, PT, DPT, GCS ?[phone] ? ? ?[name] ?02/10/2022, 1:45 PM ? ?

## 2022-02-11 DIAGNOSIS — N184 Chronic kidney disease, stage 4 (severe): Secondary | ICD-10-CM | POA: Diagnosis not present

## 2022-02-11 DIAGNOSIS — D696 Thrombocytopenia, unspecified: Secondary | ICD-10-CM | POA: Diagnosis not present

## 2022-02-11 DIAGNOSIS — N179 Acute kidney failure, unspecified: Secondary | ICD-10-CM | POA: Diagnosis not present

## 2022-02-11 DIAGNOSIS — I639 Cerebral infarction, unspecified: Secondary | ICD-10-CM | POA: Diagnosis not present

## 2022-02-11 LAB — COMPREHENSIVE METABOLIC PANEL
ALT: <5 U/L (ref 0–44)
AST: 10 U/L — ABNORMAL LOW (ref 15–41)
Albumin: 3.2 g/dL — ABNORMAL LOW (ref 3.5–5.0)
Alkaline Phosphatase: 47 U/L (ref 38–126)
Anion gap: 5 (ref 5–15)
BUN: 39 mg/dL — ABNORMAL HIGH (ref 8–23)
CO2: 18 mmol/L — ABNORMAL LOW (ref 22–32)
Calcium: 8.8 mg/dL — ABNORMAL LOW (ref 8.9–10.3)
Chloride: 118 mmol/L — ABNORMAL HIGH (ref 98–111)
Creatinine, Ser: 2.56 mg/dL — ABNORMAL HIGH (ref 0.44–1.00)
GFR, Estimated: 19 mL/min — ABNORMAL LOW (ref >60)
Glucose, Bld: 109 mg/dL — ABNORMAL HIGH (ref 70–99)
Potassium: 4.1 mmol/L (ref 3.5–5.1)
Sodium: 141 mmol/L (ref 135–145)
Total Bilirubin: 0.2 mg/dL — ABNORMAL LOW (ref 0.3–1.2)
Total Protein: 6.1 g/dL — ABNORMAL LOW (ref 6.5–8.1)

## 2022-02-11 LAB — GLUCOSE, CAPILLARY
Glucose-Capillary: 104 mg/dL — ABNORMAL HIGH (ref 70–99)
Glucose-Capillary: 127 mg/dL — ABNORMAL HIGH (ref 70–99)
Glucose-Capillary: 145 mg/dL — ABNORMAL HIGH (ref 70–99)
Glucose-Capillary: 163 mg/dL — ABNORMAL HIGH (ref 70–99)
Glucose-Capillary: 174 mg/dL — ABNORMAL HIGH (ref 70–99)
Glucose-Capillary: 193 mg/dL — ABNORMAL HIGH (ref 70–99)
Glucose-Capillary: 88 mg/dL (ref 70–99)

## 2022-02-11 LAB — CBC
HCT: 26.5 % — ABNORMAL LOW (ref 36.0–46.0)
Hemoglobin: 8.3 g/dL — ABNORMAL LOW (ref 12.0–15.0)
MCH: 28.8 pg (ref 26.0–34.0)
MCHC: 31.3 g/dL (ref 30.0–36.0)
MCV: 92 fL (ref 80.0–100.0)
Platelets: 83 10*3/uL — ABNORMAL LOW (ref 150–400)
RBC: 2.88 MIL/uL — ABNORMAL LOW (ref 3.87–5.11)
RDW: 14.7 % (ref 11.5–15.5)
WBC: 4.2 10*3/uL (ref 4.0–10.5)
nRBC: 0 % (ref 0.0–0.2)

## 2022-02-11 LAB — MAGNESIUM: Magnesium: 1.7 mg/dL (ref 1.7–2.4)

## 2022-02-11 LAB — PHOSPHORUS: Phosphorus: 4.3 mg/dL (ref 2.5–4.6)

## 2022-02-11 MED ORDER — OXYCODONE-ACETAMINOPHEN 5-325 MG PO TABS
1.0000 | ORAL_TABLET | Freq: Four times a day (QID) | ORAL | Status: DC | PRN
  Administered 2022-02-11: 1 via ORAL
  Filled 2022-02-11: qty 1

## 2022-02-11 MED ORDER — AMLODIPINE BESYLATE 10 MG PO TABS
10.0000 mg | ORAL_TABLET | Freq: Every day | ORAL | Status: DC
  Administered 2022-02-11 – 2022-02-13 (×3): 10 mg via ORAL
  Filled 2022-02-11 (×3): qty 1

## 2022-02-11 MED ORDER — SODIUM CHLORIDE 0.9 % IV SOLN: INTRAVENOUS | Status: DC

## 2022-02-11 NOTE — Progress Notes (Signed)
Physical Therapy Treatment ?Patient Details ?Name: [name] ?MRN: [id number] ?DOB: 1946-02-06 ?Today's Date: 02/11/2022 ? ? ?History of Present Illness 76 y.o. female with medical history significant for type 2 diabetes, HTN, HLD, diabetic neuropathy, glaucoma, CKD 4, recent right MCA stroke (hospitalized at [facility] from 4/5 to 02/02/2022 now with residual left upper and lower extremity weakness and left facial weakness. Pt was discharged home with home health). Pt presented to [facility] with concerns for worsening of her left facial droop. MRI brain revealed small acute infarct of the medial ventral left  thalamus and an area of combined cortical and subcortical  acute ischemia within the right MCA territory. No acute or chronic hemorrhage. (Simultaneous filing. User may not have seen previous data.) ? ?  ?PT Comments  ? ? Pt is making limited progress this date and reports she doesn't feel good. Noted hypertension in chart. Pt very vague regarding symptoms, however currently deferring all mobility this date. Agreeable to supine there-ex. Will continue to progress as able.   ?Recommendations for follow up therapy are one component of a multi-disciplinary discharge planning process, led by the attending physician.  Recommendations may be updated based on patient status, additional functional criteria and insurance authorization. ? ?Follow Up Recommendations ? Home health PT ?  ?  ?Assistance Recommended at Discharge Frequent or constant Supervision/Assistance  ?Patient can return home with the following A little help with walking and/or transfers;A little help with bathing/dressing/bathroom ?  ?Equipment Recommendations ? Rolling walker (2 wheels)  ?  ?Recommendations for Other Services   ? ? ?  ?Precautions / Restrictions Precautions ?Precautions: Fall ?Restrictions ?Weight Bearing Restrictions: No  ?  ? ?Mobility ? Bed Mobility ?  ?  ?  ?  ?  ?  ?  ?General bed mobility comments: refused mobility effort despite heavy  cues ?  ? ?Transfers ?  ?  ?  ?  ?  ?  ?  ?  ?  ?  ?  ? ?Ambulation/Gait ?  ?  ?  ?  ?  ?  ?  ?  ? ? ?Stairs ?  ?  ?  ?  ?  ? ? ?Wheelchair Mobility ?  ? ?Modified Rankin (Stroke Patients Only) ?  ? ? ?  ?Balance   ?  ?  ?  ?  ?  ?  ?  ?  ?  ?  ?  ?  ?  ?  ?  ?  ?  ?  ?  ? ?  ?Cognition Arousal/Alertness: Lethargic ?Behavior During Therapy: [facility] for tasks assessed/performed ?Overall Cognitive Status: No family/caregiver present to determine baseline cognitive functioning ?  ?  ?  ?  ?  ?  ?  ?  ?  ?  ?  ?  ?  ?  ?  ?  ?General Comments: kept eyes closed throughout session. doesn't appear to feel well during session ?  ?  ? ?  ?Exercises Other Exercises ?Other Exercises: supine ther-ex performed on B LE including heel slides, hip add squeezes, SLRs, and bridging. 10 reps with cga. Safe technique ? ?  ?General Comments   ?  ?  ? ?Pertinent Vitals/Pain Pain Assessment ?Pain Assessment: No/denies pain  ? ? ?Home Living   ?  ?  ?  ?  ?  ?  ?  ?  ?  ?   ?  ?Prior Function    ?  ?  ?   ? ?PT Goals (current  goals can now be found in the care plan section) Acute Rehab PT Goals ?Patient Stated Goal: to go home ?PT Goal Formulation: With patient ?Time For Goal Achievement: 02/24/22 ?Potential to Achieve Goals: Good ?Progress towards PT goals: Progressing toward goals ? ?  ?Frequency ? ? ? 7X/week ? ? ? ?  ?PT Plan Current plan remains appropriate  ? ? ?Co-evaluation   ?  ?  ?  ?  ? ?  ?AM-PAC PT "6 Clicks" Mobility   ?Outcome Measure ? Help needed turning from your back to your side while in a flat bed without using bedrails?: None ?Help needed moving from lying on your back to sitting on the side of a flat bed without using bedrails?: None ?Help needed moving to and from a bed to a chair (including a wheelchair)?: A Little ?Help needed standing up from a chair using your arms (e.g., wheelchair or bedside chair)?: A Little ?Help needed to walk in hospital room?: A Little ?Help needed climbing 3-5 steps with a railing? : A  Little ?6 Click Score: 20 ? ?  ?End of Session   ?Activity Tolerance: Patient tolerated treatment well ?Patient left: in bed;with bed alarm set ?Nurse Communication: Mobility status ?PT Visit Diagnosis: Unsteadiness on feet (R26.81);Difficulty in walking, not elsewhere classified (R26.2) ?  ? ? ?Time: 6270-3500 ?PT Time Calculation (min) (ACUTE ONLY): 9 min ? ?Charges:  $Therapeutic Exercise: 8-22 mins          ?          ? ?Greggory Stallion, PT, DPT, GCS ?[phone] ? ? ? ?Ryin Ambrosius ?02/11/2022, 4:13 PM ? ?

## 2022-02-11 NOTE — Progress Notes (Addendum)
Notified Neomia Glass, NP of hypertension. Provider notified this RN that she will not treat this blood pressure. Will continue to monitor.  ? 02/11/22 0025  ?Vitals  ?Temp 98.3 ?F (36.8 ?C)  ?Temp Source Oral  ?BP (!) 181/73  ?MAP (mmHg) 102  ?BP Location Right Arm  ?BP Method Automatic  ?Patient Position (if appropriate) Lying  ?Pulse Rate Source Monitor  ?ECG Heart Rate 66  ?Resp 10  ?MEWS COLOR  ?MEWS Score Color Green  ?Oxygen Therapy  ?SpO2 99 %  ?O2 Device Room Air  ?MEWS Score  ?MEWS Temp 0  ?MEWS Systolic 0  ?MEWS Pulse 0  ?MEWS RR 1  ?MEWS LOC 0  ?MEWS Score 1  ? ? ?

## 2022-02-11 NOTE — Plan of Care (Signed)
?  Problem: Education: ?Goal: Knowledge of disease or condition will improve ?Outcome: Progressing ?Goal: Knowledge of secondary prevention will improve (SELECT ALL) ?Outcome: Progressing ?Goal: Knowledge of patient specific risk factors will improve (INDIVIDUALIZE FOR PATIENT) ?Outcome: Progressing ?Goal: Individualized Educational Video(s) ?Outcome: Progressing ?  ?Problem: Coping: ?Goal: Will verbalize positive feelings about self ?Outcome: Progressing ?Goal: Will identify appropriate support needs ?Outcome: Progressing ?  ?Problem: Health Behavior/Discharge Planning: ?Goal: Ability to manage health-related needs will improve ?Outcome: Progressing ?  ?Problem: Self-Care: ?Goal: Ability to participate in self-care as condition permits will improve ?Outcome: Progressing ?Goal: Verbalization of feelings and concerns over difficulty with self-care will improve ?Outcome: Progressing ?Goal: Ability to communicate needs accurately will improve ?Outcome: Progressing ?  ?Problem: Nutrition: ?Goal: Risk of aspiration will decrease ?Outcome: Progressing ?  ?

## 2022-02-11 NOTE — TOC Initial Note (Signed)
Transition of Care (TOC) - Initial/Assessment Note  ? ? ?Patient Details  ?Name: [name] ?MRN: [id number] ?Date of Birth: Nov 16, 1945 ? ?Transition of Care (TOC) CM/SW Contact:    ?Alberteen Sam, LCSW ?Phone Number: ?02/11/2022, 10:24 AM ? ?Clinical Narrative:                 ? ?CSW notes patient is active with [facility] home health services, will need resumption orders at time of discharge.  ? ?TOC will follow for additional discharge needs.  ? ?Expected Discharge Plan: [place] ?Barriers to Discharge: Continued Medical Work up ? ? ?Patient Goals and CMS Choice ?Patient states their goals for this hospitalization and ongoing recovery are:: to go home ?CMS Medicare.gov Compare Post Acute Care list provided to:: Patient ?  ? ?Expected Discharge Plan and Services ?Expected Discharge Plan: [place] ?  ?  ?  ?  ?                ?  ?  ?  ?  ?  ?  ?[place] Agency: [place] ?  ?  ?  ? ?Prior Living Arrangements/Services ?  ?  ?  ?Do you feel safe going back to the place where you live?: Yes      ?  ?  ?  ?  ? 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 ?Permission Sought/Granted ?  ?  ?   ?   ?   ?   ? ?Emotional Assessment ?  ?  ?  ?Orientation: : Oriented to Self, Oriented to Place, Oriented to   Time, Oriented to Situation ?Alcohol / Substance Use: Not Applicable ?Psych Involvement: No (comment) ? ?Admission diagnosis:  Facial droop [R29.810] ?Recurrent cerebrovascular accidents (CVAs) ([place]) [I63.9] ?Patient Active Problem List  ? Diagnosis Date Noted  ? Acute CVA (cerebrovascular accident), recurrent(HCC) 02/09/2022  ? Glaucoma 02/09/2022  ? Diabetic polyneuropathy ([place]) 02/09/2022  ? Anemia of chronic kidney failure, stage 4 (severe) ([place]) 02/09/2022  ? Type 2 diabetes mellitus with chronic kidney disease, without long-term current use of insulin ([place]) 02/09/2022  ? Hyperlipidemia 02/09/2022  ? Acute renal failure superimposed on stage 4 chronic kidney disease ([place]) 02/09/2022  ? Malignant neoplasm of overlapping sites of right breast in female, estrogen receptor negative ([place]) 12/05/2019  ? CKD stage 4 due to type 2 diabetes mellitus ([place]) 12/30/2017  ? Essential hypertension 11/02/2011  ? ?PCP:  Pcp, No ?Pharmacy:   ?[id number] [place] [address] ?[address] ?[id number]?Phone: [phone]: [phone]? ? ? ? ?Social Determinants of Health (SDOH) Interventions ?  ? ?Readmission Risk Interventions ?   ? View : No data to display.  ?  ?  ?  ? ? ? ?

## 2022-02-11 NOTE — Progress Notes (Signed)
Occupational Therapy Treatment ?Patient Details ?Name: [name] ?MRN: [id number] ?DOB: 1946/10/02 ?Today's Date: 02/11/2022 ? ? ?History of present illness 76 y.o. female with medical history significant for type 2 diabetes, HTN, HLD, diabetic neuropathy, glaucoma, CKD 4, recent right MCA stroke (hospitalized at [facility] from 4/5 to 02/02/2022 now with residual left upper and lower extremity weakness and left facial weakness. Pt was discharged home with home health). Pt presented to [facility] with concerns for worsening of her left facial droop. MRI brain revealed small acute infarct of the medial ventral left  thalamus and an area of combined cortical and subcortical  acute ischemia within the right MCA territory. No acute or chronic hemorrhage. ?  ?OT comments ? Pt seen for OT treatment on this date. Upon arrival to room, pt awake and seated upright in bed. Pt reported being solid in urine and vocalized difficulty using call button (pt observed to push on/off switch on phone instead of red button on call bell; pt educated on how to call RN via call bell and pt successfully return demo'd education provided. Pt required SUPERVISION for bed mobility, MIN A to/from walk to BSC (~25f, 2x), MIN GUARD for sit>stand LB bathing, and SUPERVISION for seated UB dressing d/t impaired vision (at baseline), decreased activity tolerance, and decreased balance. At end of session, pt left supine in bed, with all needs within reach and BP 168/79 (RN informed of elevated BP). Pt continues to benefit from skilled OT services to maximize return to PLOF and minimize risk of future falls, injury, caregiver burden, and readmission. Will continue to follow POC. Discharge recommendation remains appropriate.    ? ?Recommendations for follow up therapy are one component of a multi-disciplinary discharge planning process, led by the attending physician.  Recommendations may be updated based on patient status, additional functional criteria and  insurance authorization. ?   ?Follow Up Recommendations ? Home health OT  ?  ?Assistance Recommended at Discharge Intermittent Supervision/Assistance  ?Patient can return home with the following ? A little help with walking and/or transfers;A little help with bathing/dressing/bathroom;Assistance with cooking/housework;Assist for transportation ?  ?Equipment Recommendations ? None recommended by OT  ?  ?   ?Precautions / Restrictions Precautions ?Precautions: Fall ?Restrictions ?Weight Bearing Restrictions: No  ? ? ?  ? ?Mobility Bed Mobility ?Overal bed mobility: Needs Assistance ?Bed Mobility: Supine to Sit, Sit to Supine ?  ?  ?Supine to sit: Supervision, HOB elevated ?Sit to supine: Supervision ?  ?  ?  ? ?Transfers ?Overall transfer level: Needs assistance ?Equipment used: None ?Transfers: Sit to/from Stand ?Sit to Stand: Min guard ?  ?  ?  ?  ?  ?  ?  ?  ?Balance Overall balance assessment: Needs assistance ?Sitting-balance support: No upper extremity supported, Feet supported ?Sitting balance-Leahy Scale: Good ?Sitting balance - Comments: Good sitting balance reaching within BOS ?  ?Standing balance support: No upper extremity supported, During functional activity ?Standing balance-Leahy Scale: Poor ?Standing balance comment: Requires MIN A via 1-person HHA to walk 340f 2x ?  ?  ?  ?  ?  ?  ?  ?  ?  ?  ?  ?   ? ?ADL either performed or assessed with clinical judgement  ? ?ADL Overall ADL's : Needs assistance/impaired ?  ?  ?Grooming: Wash/dry hands;Supervision/safety;Sitting ?  ?  ?  ?  ?  ?Upper Body Dressing : Supervision/safety;Sitting ?Upper Body Dressing Details (indicate cue type and reason): to don/doff hospital gown ?  ?  ?  Toilet Transfer: Ambulation;BSC/3in1;Min guard ?Toilet Transfer Details (indicate cue type and reason): Requires verbal and tactile cues to locate [facility] ?Toileting- Water quality scientist and Hygiene: Min guard;Sit to/from stand ?  ?  ?  ?Functional mobility during ADLs: Minimal  assistance (with 1-person HHA, pt walked 75f, 2x) ?  ?  ? ? ? ?Cognition Arousal/Alertness: Awake/alert ?[facility] ?Overall Cognitive Status: No family/caregiver present to determine baseline cognitive functioning ?  ?  ?  ?  ?  ?  ?  ?  ?  ?  ?  ?  ?  ?  ?  ?  ?General Comments: A&Ox2 ?  ?  ?   ?   ?   ?   ? ? ?Pertinent Vitals/ Pain       Pain Assessment ?Pain Assessment: 0-10 ?Pain Score: 8  ?Pain Location: head ?Pain Descriptors / Indicators: Aching ?Pain Intervention(s): Limited activity within patient's tolerance, Monitored during session, RN gave pain meds during session ? ?   ?   ? ?Frequency ? Min 2X/week  ? ? ? ? ?  ?Progress Toward Goals ? ?OT Goals(current goals can now be found in the care plan section) ? Progress towards OT goals: Progressing toward goals ? ?Acute Rehab OT Goals ?Patient Stated Goal: to return home ?OT Goal Formulation: With patient ?Time For Goal Achievement: 02/24/22 ?Potential to Achieve Goals: Good  ?Plan Discharge plan remains appropriate;Frequency remains appropriate   ? ?   ?AM-PAC OT "6 Clicks" Daily Activity     ?Outcome Measure ? ? Help from another person eating meals?: None ?Help from another person taking care of personal grooming?: A Little ?Help from another person toileting, which includes using toliet, bedpan, or urinal?: A Little ?Help from another person bathing (including washing, rinsing, drying)?: A Little ?Help from another person to put on and taking off regular upper body clothing?: None ?Help from another person to put on and taking off regular lower body clothing?: A Little ?6 Click Score: 20 ? ?  ?End of Session   ? ?OT Visit Diagnosis: Other symptoms and signs involving the nervous system (R29.898) ?  ?Activity Tolerance Patient tolerated treatment well ?  ?Patient Left in bed;with call bell/phone within reach;with bed alarm set ?  ?Nurse Communication Mobility status ?  ? ?   ? ?Time: [address]?OT Time  Calculation (min): 31 min ? ?Charges: OT General Charges ?$OT Visit: 1 Visit ?OT Treatments ?$Self Care/Home Management : 23-37 mins ? ?LFredirick Maudlin OTR/L ?[place]? ?

## 2022-02-11 NOTE — Consult Note (Signed)
? ? ?[name] is a 76 y.o. female  [id number] ? ?Primary Cardiologist: Neoma Laming, MD ?Reason for Consultation: CVA ? ?HPI: This 76 years old female with PMH significant for type 2 diabetes, hypertension, hyperlipidemia, ER negative breast cancer, diabetic neuropathy, glaucoma, CKD stage IV, recent right MCA stroke for which she was hospitalized at [facility] from 01/28/2022 - 02/02/2022 with residual left upper and lower extremity weakness and left facial weakness.  She was discharged to SNF for rehab, Patient was sent from facility with concern for worsening of her left facial droop.  She came as a stroke code.  Assessed by neurologist Dr. Cheral Marker who recommended MRI/MRA and additional stroke work-up.  Imaging confirms new acute infarcts.  Neurologist recommended TEE to rule out cardiac source of strokes.  Patient is admitted for CVA. ? ? ?Review of Systems: resting in bed, no complaints ? ? ?Past Medical History:  ?Diagnosis Date  ? Diabetes mellitus without complication ([place])   ? Hypertension   ? ? ?Medications Prior to Admission  ?Medication Sig Dispense Refill  ? acetaminophen (TYLENOL) 325 MG tablet Take 650 mg by mouth every 4 (four) hours as needed for mild pain.    ? amLODipine (NORVASC) 10 MG tablet Take 10 mg by mouth daily.    ? [facility] ASPIRIN ADULT LOW DOSE 81 MG chewable tablet Chew 81 mg by mouth daily.    ? gabapentin (NEURONTIN) 100 MG capsule Take 300 mg by mouth at bedtime.    ? hydrochlorothiazide (HYDRODIURIL) 25 MG tablet Take 25 mg by mouth daily.    ? methazolamide (NEPTAZANE) 50 MG tablet Take 50 mg by mouth 3 (three) times daily.    ? atorvastatin (LIPITOR) 40 MG tablet Take 40 mg by mouth daily.    ? ? ? ?  stroke: early stages of recovery book   Does not apply Once  ? amLODipine  10 mg Oral Daily  ? aspirin EC  81 mg Oral Daily  ? atorvastatin  80 mg Oral Daily  ? clopidogrel  75 mg Oral Daily  ? insulin aspart  0-9 Units Subcutaneous Q4H  ? ? ?Infusions: ? sodium chloride 75 mL/hr at  02/10/22 0025  ? ? ?Allergies  ?Allergen Reactions  ? Losartan   ?  Other reaction(s): Other (See Comments) ?Chest pain.  ? Pregabalin Other (See Comments)  ?  Suicidal/homocidal ideation. ?Other reaction(s): Abdominal Pain ?Suicidal ideation ?Other reaction(s): Abdominal Pain ?Suicidal ideation ?Had suicidal thoughts ?  ? ? ?Social History  ? ?Socioeconomic History  ? Marital status: Married  ?  Spouse name: Not on file  ? Number of children: Not on file  ? Years of education: Not on file  ? Highest education level: Not on file  ?Occupational History  ? Not on file  ?Tobacco Use  ? Smoking status: Former  ? Smokeless tobacco: Not on file  ?Substance and Sexual Activity  ? Alcohol use: No  ? Drug use: Not on file  ? Sexual activity: Not on file  ?Other Topics Concern  ? Not on file  ?Social History Narrative  ? Not on file  ? ?Social Determinants of Health  ? ?Financial Resource Strain: Not on file  ?Food Insecurity: Not on file  ?Transportation Needs: Not on file  ?Physical Activity: Not on file  ?Stress: Not on file  ?Social Connections: Not on file  ?Intimate Partner Violence: Not on file  ? ? ?No family history on file. ? ?PHYSICAL EXAM: ?Vitals:  ? 02/11/22  1400 02/11/22 1455  ?BP: (!) 168/69 (!) 182/81  ?Pulse:  68  ?Resp:  19  ?Temp:  (!) 96.8 ?F (36 ?C)  ?SpO2:  100%  ? ? ? ?Intake/Output Summary (Last 24 hours) at 02/11/2022 1501 ?Last data filed at 02/11/2022 1319 ?Gross per 24 hour  ?Intake 480 ml  ?Output 2150 ml  ?Net -1670 ml  ? ? ?General:  Well appearing. No respiratory difficulty ?HEENT: normal ?Neck: supple. no JVD. Carotids 2+ bilat; no bruits. No lymphadenopathy or thryomegaly appreciated. ?Cor: PMI nondisplaced. Regular rate & rhythm. No rubs, gallops or murmurs. ?Lungs: clear ?Abdomen: soft, nontender, nondistended. No hepatosplenomegaly. No bruits or masses. Good bowel sounds. ?Extremities: no cyanosis, clubbing, rash, edema ?Neuro: alert & oriented x 3, cranial nerves grossly intact. moves all  4 extremities w/o difficulty. Affect pleasant. ? ?ECG: NSR, HR 71 bpm, left anterior fascicular block no acute ischemia change ? ?Results for orders placed or performed during the hospital encounter of 02/09/22 (from the past 24 hour(s))  ?Glucose, capillary     Status: Abnormal  ? Collection Time: 02/10/22  3:54 PM  ?Result Value Ref Range  ? Glucose-Capillary 166 (H) 70 - 99 mg/dL  ? Comment 1 Notify RN   ?Glucose, capillary     Status: Abnormal  ? Collection Time: 02/10/22  7:56 PM  ?Result Value Ref Range  ? Glucose-Capillary 176 (H) 70 - 99 mg/dL  ?Glucose, capillary     Status: Abnormal  ? Collection Time: 02/11/22 12:19 AM  ?Result Value Ref Range  ? Glucose-Capillary 193 (H) 70 - 99 mg/dL  ?Glucose, capillary     Status: Abnormal  ? Collection Time: 02/11/22  4:26 AM  ?Result Value Ref Range  ? Glucose-Capillary 127 (H) 70 - 99 mg/dL  ?CBC     Status: Abnormal  ? Collection Time: 02/11/22  6:14 AM  ?Result Value Ref Range  ? WBC 4.2 4.0 - 10.5 K/uL  ? RBC 2.88 (L) 3.87 - 5.11 MIL/uL  ? Hemoglobin 8.3 (L) 12.0 - 15.0 g/dL  ? HCT 26.5 (L) 36.0 - 46.0 %  ? MCV 92.0 80.0 - 100.0 fL  ? MCH 28.8 26.0 - 34.0 pg  ? MCHC 31.3 30.0 - 36.0 g/dL  ? RDW 14.7 11.5 - 15.5 %  ? Platelets 83 (L) 150 - 400 K/uL  ? nRBC 0.0 0.0 - 0.2 %  ?Comprehensive metabolic panel     Status: Abnormal  ? Collection Time: 02/11/22  6:14 AM  ?Result Value Ref Range  ? Sodium 141 135 - 145 mmol/L  ? Potassium 4.1 3.5 - 5.1 mmol/L  ? Chloride 118 (H) 98 - 111 mmol/L  ? CO2 18 (L) 22 - 32 mmol/L  ? Glucose, Bld 109 (H) 70 - 99 mg/dL  ? BUN 39 (H) 8 - 23 mg/dL  ? Creatinine, Ser 2.56 (H) 0.44 - 1.00 mg/dL  ? Calcium 8.8 (L) 8.9 - 10.3 mg/dL  ? Total Protein 6.1 (L) 6.5 - 8.1 g/dL  ? Albumin 3.2 (L) 3.5 - 5.0 g/dL  ? AST 10 (L) 15 - 41 U/L  ? ALT <5 0 - 44 U/L  ? Alkaline Phosphatase 47 38 - 126 U/L  ? Total Bilirubin 0.2 (L) 0.3 - 1.2 mg/dL  ? GFR, Estimated 19 (L) >60 mL/min  ? Anion gap 5 5 - 15  ?Magnesium     Status: None  ? Collection Time:  02/11/22  6:14 AM  ?Result Value Ref Range  ? Magnesium 1.7  1.7 - 2.4 mg/dL  ?Phosphorus     Status: None  ? Collection Time: 02/11/22  6:14 AM  ?Result Value Ref Range  ? Phosphorus 4.3 2.5 - 4.6 mg/dL  ?Glucose, capillary     Status: Abnormal  ? Collection Time: 02/11/22  7:28 AM  ?Result Value Ref Range  ? Glucose-Capillary 104 (H) 70 - 99 mg/dL  ?Glucose, capillary     Status: Abnormal  ? Collection Time: 02/11/22 11:25 AM  ?Result Value Ref Range  ? Glucose-Capillary 174 (H) 70 - 99 mg/dL  ?Glucose, capillary     Status: Abnormal  ? Collection Time: 02/11/22 12:44 PM  ?Result Value Ref Range  ? Glucose-Capillary 163 (H) 70 - 99 mg/dL  ? Comment 1 Notify RN   ? ?MR ANGIO HEAD WO CONTRAST ? ?Result Date: 02/09/2022 ?CLINICAL DATA:  Focal neurologic deficit EXAM: MRI HEAD WITHOUT CONTRAST MRA HEAD WITHOUT CONTRAST MRA NECK WITHOUT AND WITH CONTRAST TECHNIQUE: Multiplanar, multi-echo pulse sequences of the brain and surrounding structures were acquired without intravenous contrast. Angiographic images of the Circle of Willis were acquired using MRA technique without intravenous contrast. Angiographic images of the neck were acquired using MRA technique without and with intravenous contrast. Carotid stenosis measurements (when applicable) are obtained utilizing NASCET criteria, using the distal internal carotid diameter as the denominator. CONTRAST:  44m GADAVIST GADOBUTROL 1 MMOL/ML IV SOLN COMPARISON:  None FINDINGS: MRI HEAD FINDINGS Brain: There is a small acute infarct of the medial ventral left thalamus. There is also an area of combined cortical and subcortical acute ischemia within the right MCA territory. No acute or chronic hemorrhage. There is multifocal hyperintense T2-weighted signal within the white matter. Generalized volume loss without a clear lobar predilection. There is an old anterior left temporal lobe infarct. The midline structures are normal. Vascular: Major flow voids are preserved. Skull and  upper cervical spine: Normal calvarium and skull base. Visualized upper cervical spine and soft tissues are normal. Sinuses/Orbits:No paranasal sinus fluid levels or advanced mucosal thickening. No mastoid

## 2022-02-11 NOTE — Progress Notes (Signed)
?PROGRESS NOTE ? ? ? ?[name]  MWU:[id number] DOB: 12/08/1945 DOA: 02/09/2022 ?PCP: Pcp, No ? ?Assessment & Plan: ?  ?Principal Problem: ?  Acute CVA (cerebrovascular accident), recurrent([facility]) ?Active Problems: ?  Acute renal failure superimposed on stage 4 chronic kidney disease ([place]) ?  Essential hypertension ?  Malignant neoplasm of overlapping sites of right breast in female, estrogen receptor negative ([place]) ?  Glaucoma ?  Diabetic polyneuropathy ([place]) ?  Anemia of chronic kidney failure, stage 4 (severe) ([facility]) ?  Type 2 diabetes mellitus with chronic kidney disease, without long-term current use of insulin ([place]) ?  Hyperlipidemia ? ? ?Acute CVA: w/ recent CVA on 01/28/22 with residual left hemiparesis presenting with left facial droop and possible new stroke . CT head shows new area of infarct. MRI brain confirms small acute infarct of the ventral left thalamus, no hemorrhage or mass effect. MRA head shows no large vessel occlusion. Continue on aspirin, plavix & statin. NPO after midnight for TEE ?  ?AKI on CKDIV: baseline Cr 2.48. Cr is trending down from day prior  ?  ?HLD: continue on statin  ?  ?DM2: poorly controlled, HbA1c 10.6. Continue on SSI w/ accuchecks. Pt refused to take insulin at home  ?  ?ACD: likely secondary to CKD. H&H are labile. No need for a transfusion currently  ?  ?Diabetic polyneuropathy: continue to hold home dose of gabapentin  ?  ?Breast cancer: management per onco outpatient  ?  ?HTN: restart home dose of amlodipine  ? ?Thrombocytopenia: etiology unclear. Trending down from day prior  ? ? ? ?DVT prophylaxis: SCDs ?Code Status: full  ?Family Communication: discussed pt's care w/ pt's son, Rod, and answered his questions  ?Disposition Plan: likely d/c back home w/ HH ? ?Level of care: Progressive ? ?Status is: Inpatient ?Remains inpatient appropriate because: will go for TEE tomorrow  ? ? ?Consultants:  ?Cardio ?Neuro  ? ?Procedures:  ? ?Antimicrobials: ? ?Subjective: ?Pt c/o  fatigue  ? ?Objective: ?Vitals:  ? 02/10/22 1550 02/10/22 2016 02/11/22 0025 02/11/22 0422  ?BP: 129/66 120/73 (!) 181/73 (!) 151/71  ?Pulse: 74     ?Resp: '18 18 10 14  '$ ?Temp: 98 ?F (36.7 ?C) 98.6 ?F (37 ?C) 98.3 ?F (36.8 ?C) (!) 97.3 ?F (36.3 ?C)  ?TempSrc:  Oral Oral Oral  ?SpO2: 100% 99% 99% 100%  ?Weight:      ?Height:      ? ? ?Intake/Output Summary (Last 24 hours) at 02/11/2022 0800 ?Last data filed at 02/11/2022 0422 ?Gross per 24 hour  ?Intake --  ?Output 1550 ml  ?Net -1550 ml  ? ?Filed Weights  ? 02/09/22 1726  ?Weight: 52.3 kg  ? ? ?Examination: ? ?General exam: Appears calm and comfortable. Appears older than stated age ?Respiratory system: Clear to auscultation. Respiratory effort normal. ?Cardiovascular system: S1 & S2+. No rubs, gallops or clicks.  ?Gastrointestinal system: Abdomen is nondistended, soft and nontender.  Normal bowel sounds heard. ?Central nervous system: Alert and oriented to person.  ?Psychiatry: Judgement and insight appear poor. Flat mood and affect.  ? ? ? ?Data Reviewed: I have personally reviewed following labs and imaging studies ? ?CBC: ?Recent Labs  ?Lab 02/09/22 ?1712 02/11/22 ?7253  ?WBC 4.6 4.2  ?NEUTROABS 2.0  --   ?HGB 8.7* 8.3*  ?HCT 28.4* 26.5*  ?MCV 93.4 92.0  ?PLT 111* 83*  ? ?Basic Metabolic Panel: ?Recent Labs  ?Lab 02/09/22 ?1712 02/11/22 ?6644  ?NA 139 141  ?K 3.9 4.1  ?  CL 106 118*  ?CO2 21* 18*  ?GLUCOSE 187* 109*  ?BUN 55* 39*  ?CREATININE 3.54* 2.56*  ?CALCIUM 9.2 8.8*  ?MG  --  1.7  ?PHOS  --  4.3  ? ?GFR: ?Estimated Creatinine Clearance: 13.4 mL/min (A) (by C-G formula based on SCr of 2.56 mg/dL (H)). ?Liver Function Tests: ?Recent Labs  ?Lab 02/09/22 ?1712 02/11/22 ?1191  ?AST 11* 10*  ?ALT 6 <5  ?ALKPHOS 57 47  ?BILITOT 0.2* 0.2*  ?PROT 7.0 6.1*  ?ALBUMIN 3.7 3.2*  ? ?No results for input(s): LIPASE, AMYLASE in the last 168 hours. ?No results for input(s): AMMONIA in the last 168 hours. ?Coagulation Profile: ?Recent Labs  ?Lab 02/09/22 ?1712  ?INR 1.0   ? ?Cardiac Enzymes: ?No results for input(s): CKTOTAL, CKMB, CKMBINDEX, TROPONINI in the last 168 hours. ?BNP (last 3 results) ?No results for input(s): PROBNP in the last 8760 hours. ?HbA1C: ?Recent Labs  ?  02/09/22 ?1712  ?HGBA1C 9.9*  ? ?CBG: ?Recent Labs  ?Lab 02/10/22 ?1554 02/10/22 ?1956 02/11/22 ?4782 02/11/22 ?9562 02/11/22 ?1308  ?GLUCAP 166* 176* 193* 127* 104*  ? ?Lipid Profile: ?No results for input(s): CHOL, HDL, LDLCALC, TRIG, CHOLHDL, LDLDIRECT in the last 72 hours. ?Thyroid Function Tests: ?No results for input(s): TSH, T4TOTAL, FREET4, T3FREE, THYROIDAB in the last 72 hours. ?Anemia Panel: ?No results for input(s): VITAMINB12, FOLATE, FERRITIN, TIBC, IRON, RETICCTPCT in the last 72 hours. ?Sepsis Labs: ?No results for input(s): PROCALCITON, LATICACIDVEN in the last 168 hours. ? ?No results found for this or any previous visit (from the past 240 hour(s)).  ? ? ? ? ? ?Radiology Studies: ?MR ANGIO HEAD WO CONTRAST ? ?Result Date: 02/09/2022 ?CLINICAL DATA:  Focal neurologic deficit EXAM: MRI HEAD WITHOUT CONTRAST MRA HEAD WITHOUT CONTRAST MRA NECK WITHOUT AND WITH CONTRAST TECHNIQUE: Multiplanar, multi-echo pulse sequences of the brain and surrounding structures were acquired without intravenous contrast. Angiographic images of the Circle of Willis were acquired using MRA technique without intravenous contrast. Angiographic images of the neck were acquired using MRA technique without and with intravenous contrast. Carotid stenosis measurements (when applicable) are obtained utilizing NASCET criteria, using the distal internal carotid diameter as the denominator. CONTRAST:  39m GADAVIST GADOBUTROL 1 MMOL/ML IV SOLN COMPARISON:  None FINDINGS: MRI HEAD FINDINGS Brain: There is a small acute infarct of the medial ventral left thalamus. There is also an area of combined cortical and subcortical acute ischemia within the right MCA territory. No acute or chronic hemorrhage. There is multifocal hyperintense  T2-weighted signal within the white matter. Generalized volume loss without a clear lobar predilection. There is an old anterior left temporal lobe infarct. The midline structures are normal. Vascular: Major flow voids are preserved. Skull and upper cervical spine: Normal calvarium and skull base. Visualized upper cervical spine and soft tissues are normal. Sinuses/Orbits:No paranasal sinus fluid levels or advanced mucosal thickening. No mastoid or middle ear effusion. Right globe signal abnormality with decreased size. MRA HEAD FINDINGS POSTERIOR CIRCULATION: --Vertebral arteries: Normal --Inferior cerebellar arteries: Normal. --Basilar artery: Normal. --Superior cerebellar arteries: Normal. --Posterior cerebral arteries: Multifocal occlusion of the left PCA. Normal right. ANTERIOR CIRCULATION: --Intracranial internal carotid arteries: Normal. --Anterior cerebral arteries (ACA): Normal. --Middle cerebral arteries (MCA): Short segment occlusion of the right M2 proximal superior division. Severe stenosis of the distal right M1 segment. Left MCA is patent without high-grade stenosis. ANATOMIC VARIANTS: None MRA NECK FINDINGS Aortic arch: Unremarkable Right carotid system: No occlusion or stenosis Left carotid system: No occlusion or stenosis Vertebral  arteries: Codominant system.  No occlusion or stenosis. Other: None IMPRESSION: 1. Small acute infarct of the medial ventral left thalamus. No hemorrhage or mass effect. 2. Area of combined cortical and subcortical acute ischemia within the right MCA territory. 3. Short segment occlusion of the right M2 proximal superior division. 4. Severe stenosis of the distal right M1 segment. 5. Multifocal occlusion of the left PCA. 6. Normal MRA of the neck. Electronically Signed   By: Ulyses Jarred M.D.   On: 02/09/2022 21:51  ? ?MR Angiogram Neck W or Wo Contrast ? ?Result Date: 02/09/2022 ?CLINICAL DATA:  Focal neurologic deficit EXAM: MRI HEAD WITHOUT CONTRAST MRA HEAD WITHOUT  CONTRAST MRA NECK WITHOUT AND WITH CONTRAST TECHNIQUE: Multiplanar, multi-echo pulse sequences of the brain and surrounding structures were acquired without intravenous contrast. Angiographic images of the Circle

## 2022-02-12 ENCOUNTER — Encounter
Admission: EM | Disposition: A | Discharge status: Home Health | Payer: Self-pay | Source: Home / Self Care | Attending: Internal Medicine

## 2022-02-12 ENCOUNTER — Inpatient Hospital Stay
Admit: 2022-02-12 | Discharge: 2022-02-12 | Disposition: A | Discharge status: Home / Self Care | Payer: BC Managed Care – PPO

## 2022-02-12 DIAGNOSIS — E1122 Type 2 diabetes mellitus with diabetic chronic kidney disease: Secondary | ICD-10-CM | POA: Diagnosis not present

## 2022-02-12 DIAGNOSIS — N179 Acute kidney failure, unspecified: Secondary | ICD-10-CM | POA: Diagnosis not present

## 2022-02-12 DIAGNOSIS — N184 Chronic kidney disease, stage 4 (severe): Secondary | ICD-10-CM | POA: Diagnosis not present

## 2022-02-12 DIAGNOSIS — I639 Cerebral infarction, unspecified: Secondary | ICD-10-CM | POA: Diagnosis not present

## 2022-02-12 HISTORY — PX: TEE WITHOUT CARDIOVERSION: SHX5443

## 2022-02-12 LAB — GLUCOSE, CAPILLARY
Glucose-Capillary: 107 mg/dL — ABNORMAL HIGH (ref 70–99)
Glucose-Capillary: 123 mg/dL — ABNORMAL HIGH (ref 70–99)
Glucose-Capillary: 127 mg/dL — ABNORMAL HIGH (ref 70–99)
Glucose-Capillary: 177 mg/dL — ABNORMAL HIGH (ref 70–99)
Glucose-Capillary: 223 mg/dL — ABNORMAL HIGH (ref 70–99)
Glucose-Capillary: 63 mg/dL — ABNORMAL LOW (ref 70–99)
Glucose-Capillary: 79 mg/dL (ref 70–99)
Glucose-Capillary: 94 mg/dL (ref 70–99)

## 2022-02-12 LAB — CBC
HCT: 26.6 % — ABNORMAL LOW (ref 36.0–46.0)
Hemoglobin: 8.4 g/dL — ABNORMAL LOW (ref 12.0–15.0)
MCH: 28.5 pg (ref 26.0–34.0)
MCHC: 31.6 g/dL (ref 30.0–36.0)
MCV: 90.2 fL (ref 80.0–100.0)
Platelets: 82 10*3/uL — ABNORMAL LOW (ref 150–400)
RBC: 2.95 MIL/uL — ABNORMAL LOW (ref 3.87–5.11)
RDW: 14.7 % (ref 11.5–15.5)
WBC: 3.8 10*3/uL — ABNORMAL LOW (ref 4.0–10.5)
nRBC: 0 % (ref 0.0–0.2)

## 2022-02-12 LAB — BASIC METABOLIC PANEL
Anion gap: 4 — ABNORMAL LOW (ref 5–15)
BUN: 30 mg/dL — ABNORMAL HIGH (ref 8–23)
CO2: 18 mmol/L — ABNORMAL LOW (ref 22–32)
Calcium: 8.7 mg/dL — ABNORMAL LOW (ref 8.9–10.3)
Chloride: 119 mmol/L — ABNORMAL HIGH (ref 98–111)
Creatinine, Ser: 2.34 mg/dL — ABNORMAL HIGH (ref 0.44–1.00)
GFR, Estimated: 21 mL/min — ABNORMAL LOW (ref >60)
Glucose, Bld: 104 mg/dL — ABNORMAL HIGH (ref 70–99)
Potassium: 4 mmol/L (ref 3.5–5.1)
Sodium: 141 mmol/L (ref 135–145)

## 2022-02-12 SURGERY — ECHOCARDIOGRAM, TRANSESOPHAGEAL
Anesthesia: Moderate Sedation

## 2022-02-12 MED ORDER — SODIUM CHLORIDE FLUSH 0.9 % IV SOLN
INTRAVENOUS | Status: AC
  Filled 2022-02-12: qty 10

## 2022-02-12 MED ORDER — MIDAZOLAM HCL 2 MG/2ML IJ SOLN
INTRAMUSCULAR | Status: AC | PRN
  Administered 2022-02-12: 1 mg via INTRAVENOUS

## 2022-02-12 MED ORDER — FENTANYL CITRATE (PF) 100 MCG/2ML IJ SOLN
INTRAMUSCULAR | Status: AC | PRN
  Administered 2022-02-12: 25 ug via INTRAVENOUS
  Administered 2022-02-12: 50 ug via INTRAVENOUS

## 2022-02-12 MED ORDER — DEXTROSE 50 % IV SOLN
12.5000 g | INTRAVENOUS | Status: AC
  Administered 2022-02-12: 12.5 g via INTRAVENOUS
  Filled 2022-02-12: qty 50

## 2022-02-12 MED ORDER — MIDAZOLAM HCL 5 MG/5ML IJ SOLN
INTRAMUSCULAR | Status: AC | PRN
  Administered 2022-02-12: 1 mg via INTRAVENOUS

## 2022-02-12 MED ORDER — FENTANYL CITRATE (PF) 100 MCG/2ML IJ SOLN
INTRAMUSCULAR | Status: AC
  Filled 2022-02-12: qty 2

## 2022-02-12 MED ORDER — MIDAZOLAM HCL 2 MG/2ML IJ SOLN
INTRAMUSCULAR | Status: AC
  Filled 2022-02-12: qty 2

## 2022-02-12 MED ORDER — BUTAMBEN-TETRACAINE-BENZOCAINE 2-2-14 % EX AERO
INHALATION_SPRAY | CUTANEOUS | Status: AC
  Filled 2022-02-12: qty 5

## 2022-02-12 MED ORDER — CLOPIDOGREL BISULFATE 75 MG PO TABS
75.0000 mg | ORAL_TABLET | Freq: Every day | ORAL | 0 refills | Status: AC
Start: 2022-02-13 — End: 2022-03-15

## 2022-02-12 MED ORDER — LIDOCAINE VISCOUS HCL 2 % MT SOLN
OROMUCOSAL | Status: AC
  Filled 2022-02-12: qty 15

## 2022-02-12 NOTE — Progress Notes (Signed)
OT Cancellation Note ? ?Patient Details ?Name: [name] ?MRN: [id number] ?DOB: 05/02/1946 ? ? ?Cancelled Treatment:    Reason Eval/Treat Not Completed: Other (comment) (attempted to see pt, pt asleep in bed and declines all mobility and ADL participation. Pt wishes respected. OT will reattempt as able.) ? ?Shanon Payor, OTD OTR/L  ?02/12/22, 3:47 PM  ?

## 2022-02-12 NOTE — Progress Notes (Signed)
*  PRELIMINARY RESULTS* ?Echocardiogram ?Echocardiogram Transesophageal has been performed. ? ?Dashea Mcmullan, [place] ?02/12/2022, 9:46 AM ?

## 2022-02-12 NOTE — Discharge Summary (Addendum)
Physician Discharge Summary  ?[name] PPI:[id number] DOB: Jul 23, 1946 DOA: 02/09/2022 ? ?PCP: Pcp, No ? ?Admit date: 02/09/2022 ?Discharge date: 02/13/22 ? ?Admitted From: home  ?Disposition:  home w/ home health  ? ?Recommendations for Outpatient Follow-up:  ?Follow up with PCP in 1-2 weeks ?F/u w/ neuro, Dr. Melrose Nakayama, in 1-2 weeks ? ?Home Health: yes ?Equipment/Devices: ? ?Discharge Condition: stable ?CODE STATUS: full  ?Diet recommendation: Heart Healthy / Carb Modified ? ?Brief/Interim Summary: ?HPI was taken from Dr. Damita Dunnings: ?[name] is a 76 y.o. female with medical history significant for Non-insulin type 2 diabetes who previously declined insulin (A1c 10.6 on 01/28/2022), HTN, HLD, ER-negative breast cancer, diabetic neuropathy, glaucoma, CKD 4, recent right MCA stroke for which she was hospitalized at [facility] from 4/5 to 02/02/2022 now with residual left upper and lower extremity weakness and left facial weakness and discharged with home health who was sent from the facility with concerns for worsening of her left facial droop coming into the ED as a code stroke.  Last known well was at 9 AM and symptoms started at 3:30 PM.  Patient is currently on aspirin 81 mg as well as atorvastatin.  CT head showed a possible hypodensity in the right frontal lobe involving the precentral gyrus that is new possibly result representing new infarct.  Patient was seen in the ED by neurologist, Dr. Cheral Marker who recommended MRI/MRA and additional stroke work-up.  Hospitalist consulted for admission. ?Additional data review: Vitals in the ED were within normal limits.  Blood work significant for creatinine of 3.54, up from 2.48 on 4/5, and hemoglobin 8.7 (Hb 8.4 on 4/5).  EKG, personally viewed and interpreted with sinus rhythm at 71 with no acute ST-T wave changes.  Chest x-ray with faint chronic bilateral upper lobe interstitial densities ? ?As per Dr. Jimmye Norman 4/19-4/20/23: Pt was found to have acute CVA on admission. Pt was  started on plavix and continue on aspirin and statin as per neuro. MRI brain confirms small acute infarct of the ventral left thalamus, no hemorrhage of mass effect. MRA head shows no large vessel occlusion. Pt is s/p TEE was neg for any PFOs/shunts. Pt will f/u outpatient w/ neuro in 1-2 weeks. For more information, please see previous progress/consult notes  ? ?Discharge Diagnoses:  ?Principal Problem: ?  Acute CVA (cerebrovascular accident), recurrent(HCC) ?Active Problems: ?  Acute renal failure superimposed on stage 4 chronic kidney disease ([place]) ?  Essential hypertension ?  Malignant neoplasm of overlapping sites of right breast in female, estrogen receptor negative ([place]) ?  Glaucoma ?  Diabetic polyneuropathy ([place]) ?  Anemia of chronic kidney failure, stage 4 (severe) (HCC) ?  Type 2 diabetes mellitus with chronic kidney disease, without long-term current use of insulin ([place]) ?  Hyperlipidemia ? ? ?Acute CVA: w/ recent CVA on 01/28/22 with residual left hemiparesis presenting with left facial droop and possible new stroke . CT head shows new area of infarct. MRI brain confirms small acute infarct of the ventral left thalamus, no hemorrhage or mass effect. MRA head shows no large vessel occlusion. Continue on aspirin, plavix & statin. S/p TEE which was neg for PFO/shunts.  ?  ?AKI on CKDIV: baseline Cr 2.48. Ccr continues to trend down  ?  ?HLD: continue on statin  ?  ?DM2: poorly controlled, HbA1c 10.6. Continue on SSI w/ accuchecks. Pt previously refused to take insulin at home  ?  ?ACD: likely secondary to CKD. H&H are labile. No need for a transfusion currently  ?  ?  Diabetic polyneuropathy: restart home dose of gabapentin   ?  ?Breast cancer: management per [facility] outpatient  ?  ?HTN: restart home dose of amlodipine  ? ?Thrombocytopenia: etiology unclear. Labile  ? ?Discharge Instructions ? ?Discharge Instructions   ? ? Diet - low sodium heart healthy   Complete by: As directed ?  ? Diet Carb Modified    Complete by: As directed ?  ? Discharge instructions   Complete by: As directed ?  ? F/u w/ PCP in 1-2 weeks. F/u w/ neuro, Dr. Melrose Nakayama, in 1-2 weeks  ? Increase activity slowly   Complete by: As directed ?  ? ?  ? ?Allergies as of 02/13/2022   ? ?   Reactions  ? Losartan   ? Other reaction(s): Other (See Comments) ?Chest pain.  ? Pregabalin Other (See Comments)  ? Suicidal/homocidal ideation. ?Other reaction(s): Abdominal Pain ?Suicidal ideation ?Other reaction(s): Abdominal Pain ?Suicidal ideation ?Had suicidal thoughts  ? ?  ? ?  ?Medication List  ?  ? ?TAKE these medications   ? ?acetaminophen 325 MG tablet ?Commonly known as: TYLENOL ?Take 650 mg by mouth every 4 (four) hours as needed for mild pain. ?  ?amLODipine 10 MG tablet ?Commonly known as: NORVASC ?Take 10 mg by mouth daily. ?  ?atorvastatin 40 MG tablet ?Commonly known as: LIPITOR ?Take 40 mg by mouth daily. ?  ?blood glucose meter kit and supplies ?Dispense based on patient and insurance preference. Use up to four times daily as directed. (FOR ICD-10 E10.9, E11.9). ?  ?clopidogrel 75 MG tablet ?Commonly known as: PLAVIX ?Take 1 tablet (75 mg total) by mouth daily. ?  ?[employer] Aspirin Adult Low Dose 81 MG chewable tablet ?Generic drug: aspirin ?Chew 81 mg by mouth daily. ?  ?FreeStyle Libre 2 Sensor Misc ?Change the freestyle libre sensor every 14 days x 1 month. Enough sensors to last 1 month ?  ?gabapentin 100 MG capsule ?Commonly known as: NEURONTIN ?Take 300 mg by mouth at bedtime. ?  ?hydrochlorothiazide 25 MG tablet ?Commonly known as: HYDRODIURIL ?Take 25 mg by mouth daily. ?  ?methazolamide 50 MG tablet ?Commonly known as: NEPTAZANE ?Take 50 mg by mouth 3 (three) times daily. ?  ?Trulicity 5.28 UX/3.2GM Sopn ?Generic drug: Dulaglutide ?Inject 0.75 mg into the skin once a week. ?  ? ?  ? ?  ?  ? ? ?  ?Durable Medical Equipment  ?(From admission, onward)  ?  ? ? ?  ? ?  Start     Ordered  ? 02/12/22 1507  For home use only DME Walker rolling  Once        ?Question Answer Comment  ?Walker: With 5 Inch Wheels   ?Patient needs a walker to treat with the following condition Generalized weakness   ?  ? 02/12/22 1506  ? 02/12/22 1123  For home use only DME Bedside commode  Once       ?Question:  Patient needs a bedside commode to treat with the following condition  Answer:  Generalized weakness  ? 02/12/22 1122  ? ?  ?  ? ?  ? ? Follow-up Information   ? ? Anabel Bene, MD Follow up on 02/26/2022.   ?Specialty: Neurology ?Why: 10:30am with the PA Luella Cook ?Contact information: ?[place] ?[facility] ?[place] [address] ?[phone] ? ? ?  ?  ? ?  ?  ? ?  ? ?Allergies  ?Allergen Reactions  ? Losartan   ?  Other reaction(s): Other (See Comments) ?Chest pain.  ? Pregabalin Other (See Comments)  ?  Suicidal/homocidal ideation. ?Other reaction(s): Abdominal Pain ?Suicidal ideation ?Other reaction(s): Abdominal Pain ?Suicidal ideation ?Had suicidal thoughts ?  ? ? ?Consultations: ?Neuro  ?Cardio  ? ? ?Procedures/Studies: ?MR ANGIO HEAD WO CONTRAST ? ?Result Date: 02/09/2022 ?CLINICAL DATA:  Focal neurologic deficit EXAM: MRI HEAD WITHOUT CONTRAST MRA HEAD WITHOUT CONTRAST MRA NECK WITHOUT AND WITH CONTRAST TECHNIQUE: Multiplanar, multi-echo pulse sequences of the brain and surrounding structures were acquired without intravenous contrast. Angiographic images of the Circle of Willis were acquired using MRA technique without intravenous contrast. Angiographic images of the neck were acquired using MRA technique without and with intravenous contrast. Carotid stenosis measurements (when applicable) are obtained utilizing NASCET criteria, using the distal internal carotid diameter as the denominator. CONTRAST:  43m GADAVIST GADOBUTROL 1 MMOL/ML IV SOLN COMPARISON:  None FINDINGS: MRI HEAD FINDINGS Brain: There is a small acute infarct of the medial ventral left thalamus. There is also an area of combined cortical and subcortical acute  ischemia within the right MCA territory. No acute or chronic hemorrhage. There is multifocal hyperintense T2-weighted signal within the white matter. Generalized volume loss without a clear lobar predilection. Ther

## 2022-02-12 NOTE — Progress Notes (Signed)
Hypoglycemic Event ? ?CBG: 63  ? ?Treatment: D50 25 mL (12.5 gm) ? ?Symptoms:  lethargic ? ?Follow-up CBG: Time:2030  ?CBG Result:233 ? ? ?Possible Reasons for Event: Inadequate meal intake ? ? ? ? ? ?

## 2022-02-12 NOTE — TOC Transition Note (Addendum)
Transition of Care (TOC) - CM/SW Discharge Note ? ? ?Patient Details  ?Name: [name] ?MRN: [id number] ?Date of Birth: 07/02/1946 ? ?Transition of Care (TOC) CM/SW Contact:  ?Alberteen Sam, LCSW ?Phone Number: ?02/12/2022, 12:03 PM ? ? ?Clinical Narrative:    ? ?Patient to dc home today with home health PT/OT. Patient was active with [facility] home health. CSW called [facility] home health at [phone], they requested home health orders be faxed to [phone], the requested documents have been faxed.  ? ?Rhonda with [employer] to deliver 3in1 and rolling walker to bedside.  ? ?No further discharge needs identified.  ? ? ?Final next level of care: [place] ?Barriers to Discharge: No Barriers Identified ? ? ?Patient Goals and CMS Choice ?Patient states their goals for this hospitalization and ongoing recovery are:: to go home ?CMS Medicare.gov Compare Post Acute Care list provided to:: Patient ?Choice offered to / list presented to : Patient ? ?Discharge Placement ?  ?           ?  ?  ?  ?Patient and family notified of of transfer: 02/12/22 ? ?Discharge Plan and Services ?  ?  ?           ?DME Arranged: 3-N-1 ?DME Agency: [employer] ?Date DME Agency Contacted: 02/12/22 ?Time DME Agency Contacted: 1200 ?Representative spoke with at DME Agency: Suanne Marker ?HH Arranged: PT, OT ?[place] Agency: [place] ?Date HH Agency Contacted: 02/12/22 ?Time [place]: 1200 ?Representative spoke with at [place]: Coralyn Mark ? ?Social Determinants of Health (SDOH) Interventions ?  ? ? ?Readmission Risk Interventions ?   ? View : No data to display.  ?  ?  ?  ? ? ? ? ? ?

## 2022-02-12 NOTE — Progress Notes (Signed)
PT Cancellation Note ? ?Patient Details ?Name: [name] ?MRN: [id number] ?DOB: [birth year] ? ? ?Cancelled Treatment:     PT attempt. Pt leaving floor for TEE. PT will continue to follow and progress per current POC.  ? ? ?Willette Pa ?02/12/2022, 9:22 AM ?

## 2022-02-12 NOTE — Progress Notes (Signed)
Physical Therapy Treatment ?Patient Details ?Name: [name] ?MRN: [id number] ?DOB: May 12, 1946 ?Today's Date: 02/12/2022 ? ? ?History of Present Illness 76 y.o. female with medical history significant for type 2 diabetes, HTN, HLD, diabetic neuropathy, glaucoma, CKD 4, recent right MCA stroke (hospitalized at [facility] from 4/5 to 02/02/2022 now with residual left upper and lower extremity weakness and left facial weakness. Pt was discharged home with home health). Pt presented to [facility] with concerns for worsening of her left facial droop. MRI brain revealed small acute infarct of the medial ventral left  thalamus and an area of combined cortical and subcortical  acute ischemia within the right MCA territory. No acute or chronic hemorrhage. ? ?  ?PT Comments  ? ? Pt was long sitting in bed upon arriving. She is alert however disoriented. Vision deficits at baseline.Per son in rm "she is not usually this confused. I worry about her being home alone during the day." Pt was able to follow simple commands throughout session with increased time to process. She uses SPC at baseline but does currently require RW during all standing activity for safety due to balance concerns. She ambulated ~ 75 ft prior to requesting to have BM. Pt proceeded to ambulate to BR and successfully use toilet prior to ambulation back to bed. Per son, pt is home from 8 am-3 pm during the week since spouse works. Author recommends pt have some form of assistance/supervision at home until pt returns to [facility] mobility wise. Acute PT recommends home with HHPT + supervision/assistance for all ADLs. ?  ?Recommendations for follow up therapy are one component of a multi-disciplinary discharge planning process, led by the attending physician.  Recommendations may be updated based on patient status, additional functional criteria and insurance authorization. ? ?Follow Up Recommendations ? Home health PT ?  ?  ?Assistance Recommended at Discharge Frequent or  constant Supervision/Assistance  ?Patient can return home with the following A little help with walking and/or transfers;A little help with bathing/dressing/bathroom;Assistance with cooking/housework;Assistance with feeding;Direct supervision/assist for medications management;Direct supervision/assist for financial management;Assist for transportation;Help with stairs or ramp for entrance ?  ?Equipment Recommendations ? Rolling walker (2 wheels)  ?  ?   ?Precautions / Restrictions Precautions ?Precautions: Fall ?Restrictions ?Weight Bearing Restrictions: No  ?  ? ?Mobility ? Bed Mobility ?Overal bed mobility: Needs Assistance ?Bed Mobility: Supine to Sit, Sit to Supine ?  ?  ?Supine to sit: Supervision, HOB elevated ?Sit to supine: Supervision ?  ?General bed mobility comments: increased time to perform ?  ? ?Transfers ?Overall transfer level: Needs assistance ?Equipment used: Rolling walker (2 wheels) ?Transfers: Sit to/from Stand ?Sit to Stand: Min guard ?  ?  ?  ?  ?  ?General transfer comment: pt is extremely unsteady without BUE support in standing ?  ? ?Ambulation/Gait ?Ambulation/Gait assistance: Min guard, Min assist ?Gait Distance (Feet): 75 Feet ?Assistive device: Rolling walker (2 wheels) ?Gait Pattern/deviations: Step-through pattern ?Gait velocity: decreased ?  ?  ?General Gait Details: Pt was able to ambulate ~ 71f total with slow cadence. She does not have LOB but does require assistance to safely navigate through hallway due to vision deficits. ? ? ?  ?Balance Overall balance assessment: Needs assistance ?Sitting-balance support: No upper extremity supported, Feet supported ?Sitting balance-Leahy Scale: Good ?  ?  ?Standing balance support: Bilateral upper extremity supported, During functional activity, Reliant on assistive device for balance ?Standing balance-Leahy Scale: Fair ?Standing balance comment: pt is high fall risk. Used a SPC  at baseline but now requiring RW for safety. Pt will need  assist/supervision ?  ?  ? ?  ?Cognition Arousal/Alertness: Awake/alert ?Behavior During Therapy: [facility] for tasks assessed/performed ?Overall Cognitive Status: Impaired/Different from baseline ?  ?   ?General Comments: Pt is alert and able to follow simple commands however is disoriented to location/time/ and overall situation ?  ?  ? ?  ?   ?   ? ?Pertinent Vitals/Pain Pain Assessment ?Pain Assessment: No/denies pain ?Pain Score: 0-No pain  ? ? ? ?PT Goals (current goals can now be found in the care plan section) Acute Rehab PT Goals ?Patient Stated Goal: to go home ?Progress towards PT goals: Progressing toward goals ? ?  ?Frequency ? ? ? 7X/week ? ? ? ?  ?PT Plan Current plan remains appropriate  ? ? ?   ?AM-PAC PT "6 Clicks" Mobility   ?Outcome Measure ? Help needed turning from your back to your side while in a flat bed without using bedrails?: None ?Help needed moving from lying on your back to sitting on the side of a flat bed without using bedrails?: None ?Help needed moving to and from a bed to a chair (including a wheelchair)?: A Little ?Help needed standing up from a chair using your arms (e.g., wheelchair or bedside chair)?: A Little ?Help needed to walk in hospital room?: A Little ?Help needed climbing 3-5 steps with a railing? : A Little ?6 Click Score: 20 ? ?  ?End of Session   ?Activity Tolerance: Patient tolerated treatment well ?Patient left: in bed;with bed alarm set ?Nurse Communication: Mobility status ?PT Visit Diagnosis: Unsteadiness on feet (R26.81);Difficulty in walking, not elsewhere classified (R26.2) ?  ? ? ?Time: 4696-2952 ?PT Time Calculation (min) (ACUTE ONLY): 23 min ? ?Charges:  $Gait Training: 8-22 mins ?$Therapeutic Activity: 8-22 mins          ?          ? ?Julaine Fusi PTA ?02/12/22, 3:43 PM  ? ?

## 2022-02-13 ENCOUNTER — Encounter: Payer: Self-pay | Admitting: Cardiovascular Disease

## 2022-02-13 LAB — BASIC METABOLIC PANEL
Anion gap: 8 (ref 5–15)
BUN: 32 mg/dL — ABNORMAL HIGH (ref 8–23)
CO2: 18 mmol/L — ABNORMAL LOW (ref 22–32)
Calcium: 8.9 mg/dL (ref 8.9–10.3)
Chloride: 117 mmol/L — ABNORMAL HIGH (ref 98–111)
Creatinine, Ser: 2.41 mg/dL — ABNORMAL HIGH (ref 0.44–1.00)
GFR, Estimated: 20 mL/min — ABNORMAL LOW (ref >60)
Glucose, Bld: 145 mg/dL — ABNORMAL HIGH (ref 70–99)
Potassium: 4.5 mmol/L (ref 3.5–5.1)
Sodium: 143 mmol/L (ref 135–145)

## 2022-02-13 LAB — GLUCOSE, CAPILLARY
Glucose-Capillary: 107 mg/dL — ABNORMAL HIGH (ref 70–99)
Glucose-Capillary: 131 mg/dL — ABNORMAL HIGH (ref 70–99)
Glucose-Capillary: 160 mg/dL — ABNORMAL HIGH (ref 70–99)
Glucose-Capillary: 167 mg/dL — ABNORMAL HIGH (ref 70–99)

## 2022-02-13 LAB — CBC
HCT: 26.9 % — ABNORMAL LOW (ref 36.0–46.0)
Hemoglobin: 8.5 g/dL — ABNORMAL LOW (ref 12.0–15.0)
MCH: 28.8 pg (ref 26.0–34.0)
MCHC: 31.6 g/dL (ref 30.0–36.0)
MCV: 91.2 fL (ref 80.0–100.0)
Platelets: 76 10*3/uL — ABNORMAL LOW (ref 150–400)
RBC: 2.95 MIL/uL — ABNORMAL LOW (ref 3.87–5.11)
RDW: 14.8 % (ref 11.5–15.5)
WBC: 7.9 10*3/uL (ref 4.0–10.5)
nRBC: 0 % (ref 0.0–0.2)

## 2022-02-13 MED ORDER — FREESTYLE LIBRE 2 SENSOR MISC
0 refills | Status: DC
Start: 2022-02-13 — End: 2024-04-26

## 2022-02-13 MED ORDER — NOVOLOG FLEXPEN 100 UNIT/ML ~~LOC~~ SOPN: PEN_INJECTOR | SUBCUTANEOUS | 0 refills | Status: DC

## 2022-02-13 MED ORDER — INSULIN PEN NEEDLE 32G X 4 MM MISC
0 refills | Status: DC
Start: 2022-02-13 — End: 2022-02-13

## 2022-02-13 MED ORDER — TRULICITY 0.75 MG/0.5ML ~~LOC~~ SOAJ: 0.7500 mg | SUBCUTANEOUS | 0 refills | Status: AC

## 2022-02-13 MED ORDER — BLOOD GLUCOSE METER KIT: PACK | 0 refills | Status: DC

## 2022-02-13 NOTE — TOC Transition Note (Addendum)
Transition of Care (TOC) - CM/SW Discharge Note ? ? ?Patient Details  ?Name: [name] ?MRN: [id number] ?Date of Birth: Feb 02, 1946 ? ?Transition of Care (TOC) CM/SW Contact:  ?Alberteen Sam, LCSW ?Phone Number: ?02/13/2022, 10:01 AM ? ? ?Clinical Narrative:    ? ?Patient to discharge home today with home health services through [place] has been sent home health orders and have called this CSW to confirm they will resume patient's home services upon discharge today.  ? ?CSW spoke with patient's son Rod about patient's insulin management at home, he reports he is aware him and family can mark patient's insulin with a sharpie for her to better see the dosage if home alone, as patient has glaucoma and visual deficits at baseline. CSW appreciates pharmacy in discussing this with treatment team as well for management of insulin at home.  ? ?Patient's son reports patient's husband will pick up at time of discharge today. List of private care agencies provided to son as well.  ? ?No further discharge needs identified at this time. Son confirms no further questions or concerns regarding discharge today.  ? ?Patient see's Dr. Edison Pace at [facility] for PCP apts with next apt on 4/26 per spouse.  ? ? ?Final next level of care: [place] ?Barriers to Discharge: No Barriers Identified ? ? ?Patient Goals and CMS Choice ?Patient states their goals for this hospitalization and ongoing recovery are:: to go home ?CMS Medicare.gov Compare Post Acute Care list provided to:: Patient Represenative (must comment) (son) ?Choice offered to / list presented to : Adult Children ? ?Discharge Placement ?  ?           ?  ?  ?  ?Patient and family notified of of transfer: 02/12/22 ? ?Discharge Plan and Services ?  ?  ?           ?DME Arranged: 3-N-1 ?DME Agency: [employer] ?Date DME Agency Contacted: 02/12/22 ?Time DME Agency Contacted: 1200 ?Representative spoke with at DME Agency: Suanne Marker ?HH Arranged: PT, OT ?[place] Agency:  [place] ?Date HH Agency Contacted: 02/12/22 ?Time [place]: 1200 ?Representative spoke with at [place]: Coralyn Mark ? ?Social Determinants of Health (SDOH) Interventions ?  ? ? ?Readmission Risk Interventions ?   ? View : No data to display.  ?  ?  ?  ? ? ? ? ? ?

## 2022-02-13 NOTE — Progress Notes (Signed)
Occupational Therapy Treatment ?Patient Details ?Name: [name] ?MRN: [id number] ?DOB: [birth year] ?Today's Date: 02/13/2022 ? ? ?History of present illness 76 y.o. female with medical history significant for type 2 diabetes, HTN, HLD, diabetic neuropathy, glaucoma, CKD 4, recent right MCA stroke (hospitalized at [facility] from 4/5 to 02/02/2022 now with residual left upper and lower extremity weakness and left facial weakness. Pt was discharged home with home health). Pt presented to [facility] with concerns for worsening of her left facial droop. MRI brain revealed small acute infarct of the medial ventral left  thalamus and an area of combined cortical and subcortical  acute ischemia within the right MCA territory. No acute or chronic hemorrhage. ?  ?OT comments ? Chart reviewed to date, RN cleared pt for participation in OT tx session. Tx session targeted self care/home management skills via simulated med management to facilitate effective and appropriate compliance at home. Baseline pt presents with visual deficits in which she endorses no change from prior to admission. Pt participated in simulated med managment task. Pt reports grandaughter fills pill organizer and pt reports she removes all pills from correlating day and takes pills once per day. Pt able to identify correct day when prompted on pill organizer. Pt is unable to visualize any/all simulated pills and removes all from pill organizer when prompted (unable to remove one at a time). Pt performs amb with HHA with MIN A, standing grooming tasks with close supervision with tactile and verbal cues due to visual deficits. Pt is left as received, NAD, all needs met. OT will continue to follow while admitted.   ? ?Recommendations for follow up therapy are one component of a multi-disciplinary discharge planning process, led by the attending physician.  Recommendations may be updated based on patient status, additional functional criteria and insurance authorization. ?    ?Follow Up Recommendations ? Home health OT  ?  ?Assistance Recommended at Discharge Frequent or constant Supervision/Assistance  ?Patient can return home with the following ? A little help with walking and/or transfers;A little help with bathing/dressing/bathroom;Assistance with cooking/housework;Assist for transportation ?  ?Equipment Recommendations ? None recommended by OT  ?  ?Recommendations for Other Services   ? ?  ?Precautions / Restrictions Precautions ?Precautions: Fall ?Restrictions ?Weight Bearing Restrictions: No  ? ? ?  ? ?Mobility Bed Mobility ?  ?  ?  ?  ?  ?  ?  ?  ?  ? ?Transfers ?  ?  ?  ?  ?  ?  ?  ?  ?  ?  ?  ?  ?Balance Overall balance assessment: Needs assistance ?Sitting-balance support: No upper extremity supported, Feet supported ?Sitting balance-Leahy Scale: Good ?  ?  ?Standing balance support: During functional activity, Single extremity supported ?Standing balance-Leahy Scale: Fair ?  ?  ?  ?  ?  ?  ?  ?  ?  ?  ?  ?  ?   ? ?ADL either performed or assessed with clinical judgement  ? ?ADL Overall ADL's : Needs assistance/impaired ?  ?  ?  ?  ?  ?  ?  ?  ?  ?  ?  ?  ?  ?  ?  ?  ?  ?  ?  ?General ADL Comments: supine<>sit with suprevision, STS with MIN A with HHA, supervision with verbal and tactile cues due to visual deficits for grooming tasks at sink level; MIN A HHA amb approx 15 feet to chair ?  ? ?Extremity/Trunk Assessment   ?  ?  ?  ?  ?  ? ?  Vision   ?  ?  ?Perception   ?  ?Praxis   ?  ? ?Cognition Arousal/Alertness: Awake/alert ?Behavior During Therapy: [facility] for tasks assessed/performed ?Overall Cognitive Status: No family/caregiver present to determine baseline cognitive functioning ?Area of Impairment: Attention, Following commands, Safety/judgement, Awareness, Problem solving ?  ?  ?  ?  ?  ?  ?  ?  ?  ?Current Attention Level: Sustained ?  ?Following Commands: Follows one step commands consistently, Follows multi-step commands inconsistently ?Safety/Judgement: Decreased  awareness of deficits ?Awareness: Emergent ?Problem Solving: Requires verbal cues, Requires tactile cues ?General Comments: Pt oriented to self, place, year; not oriented to situation; ?  ?  ?   ?Exercises Other Exercises ?Other Exercises: pt participated in simulated med managment task. Pt reports grandaughter fills pill organizer and pt reports she takes pills once per day.Pt able to identify correct day when prompted on pill organizer. Pt is unable to visualize any/all simulated pills and removes all from pill organizer at a time. ? ?  ?Shoulder Instructions   ? ? ?  ?General Comments    ? ? ?Pertinent Vitals/ Pain       Pain Assessment ?Pain Assessment: No/denies pain ? ?Home Living   ?  ?  ?  ?  ?  ?  ?  ?  ?  ?  ?  ?  ?  ?  ?  ?  ?  ?  ? ?  ?Prior Functioning/Environment    ?  ?  ?  ?   ? ?Frequency ? Min 2X/week  ? ? ? ? ?  ?Progress Toward Goals ? ?OT Goals(current goals can now be found in the care plan section) ? Progress towards OT goals: Progressing toward goals ? ?Acute Rehab OT Goals ?Patient Stated Goal: go home ?OT Goal Formulation: With patient ?Time For Goal Achievement: 02/27/22 ?Potential to Achieve Goals: Good  ?Plan Discharge plan remains appropriate;Frequency remains appropriate   ? ?Co-evaluation ? ? ?   ?  ?  ?  ?  ? ?  ?AM-PAC OT "6 Clicks" Daily Activity     ?Outcome Measure ? ? Help from another person eating meals?: None ?Help from another person taking care of personal grooming?: A Little ?  ?Help from another person bathing (including washing, rinsing, drying)?: A Little ?Help from another person to put on and taking off regular upper body clothing?: None ?Help from another person to put on and taking off regular lower body clothing?: A Little ?6 Click Score: 17 ? ?  ?End of Session Equipment Utilized During Treatment: Rolling walker (2 wheels) ? ?OT Visit Diagnosis: Other symptoms and signs involving the nervous system (R29.898) ?  ?Activity Tolerance Patient tolerated treatment  well ?  ?Patient Left with call bell/phone within reach;in chair;with chair alarm set ?  ?Nurse Communication Mobility status ?  ? ?   ? ?Time: 1594-5859 ?OT Time Calculation (min): 20 min ? ?Charges: OT General Charges ?$OT Visit: 1 Visit ?OT Treatments ?$Self Care/Home Management : 8-22 mins ? ?Shanon Payor, OTD OTR/L  ?02/13/22, 11:55 AM  ?

## 2022-02-13 NOTE — Progress Notes (Addendum)
Inpatient Diabetes Program Recommendations ? ?AACE/ADA: New Consensus Statement on Inpatient Glycemic Control (2015) ? ?Target Ranges:  Prepandial:   less than 140 mg/dL ?     Peak postprandial:   less than 180 mg/dL (1-2 hours) ?     Critically ill patients:  140 - 180 mg/dL  ? ? Latest Reference Range & Units 02/09/22 17:12  ?Hemoglobin A1C 4.8 - 5.6 % 9.9 (H) ? ?(237 mg/dl)  ?(H): Data is abnormally high ? Latest Reference Range & Units 02/12/22 08:41 02/12/22 09:12 02/12/22 12:22 02/12/22 16:24 02/12/22 20:10 02/12/22 20:31  ?Glucose-Capillary 70 - 99 mg/dL 94 79 127 (H) 177 (H) 63 (L) 223 (H)  ? ? Latest Reference Range & Units 02/12/22 23:59 02/13/22 03:42 02/13/22 08:04  ?Glucose-Capillary 70 - 99 mg/dL 160 (H) 131 (H) 107 (H)  ? ? ?Admit with: Acute CVA ? ?History: DM, CKD ? ?Home DM Meds: None listed ? ?Current Orders: Novolog Sensitive Correction Scale/ SSI (0-9 units) Q4 hours ? ? ? ?AM CBGs have been very well controlled for the last 3 days and pt requiring very little Novolog SSI. ? ? ? ?Addendum 2:10pm--Met w pt and husband at bedside.  Husband told me pt was taking Lantus insulin (pen) but was told to stop the Lantus in January by her PCP.  Pt was recently in [facility] for admission 01/28/2022--Husband told me they discharged pt with Rx for Trulicity 0.08 mg Qweek + the medical staff started pt on Freestyle Libre 2 CGM.  Husband told me the phone they use to scan for CBGs said the sensor was bad so he took the sensor off her arm several days ago.  Husband stated he went to pick up the Trulicity and the [employer] 2 sensors at the pharmacy but they did not have either and he has been calling the PCP office to get the meds filled.  Husband takes Trulicity once weekly at home and is familiar with how to give and can help wife (patient) take the Trulicity at home.  Verbally reviewed with husband how to store and give the Trulicity.  Husband told me they [facility] hospital sent his son a video on how to  apply and use the Freestyle Libre CGM.  I got permission from Dr. Jimmye Norman to go ahead and apply a new Sensor to pt's arm prior to discharge and Dr. Jimmye Norman to give pt refill on the [facility] Sensor as well.  Husband knows to contact PCP for further refills.  Instructed husband at bedside how to use/apply [facility] sensor.  Applied new sensor to pt's L upper arm and pt's husband instructed to go home and open the app on the phone that is set up with with freestyle app.  Instructed husband to scan for new sensor when he gets home and then there will be 1 hr warm up time before CBGs can be taken.  Husband educated to change sensor Q14 days.  Gave pt's husband an extra sensor to take home as well. ? ? ?--Will follow patient during hospitalization-- ? ?Wyn Quaker RN, MSN, CDE ?Diabetes Coordinator ?Inpatient Glycemic Control Team ?Team Pager: [phone] (8a-5p) ? ? ? ?

## 2022-02-13 NOTE — Plan of Care (Signed)
?  Problem: Education: ?Goal: Knowledge of disease or condition will improve ?Outcome: Progressing ?Goal: Knowledge of secondary prevention will improve (SELECT ALL) ?Outcome: Progressing ?Goal: Knowledge of patient specific risk factors will improve (INDIVIDUALIZE FOR PATIENT) ?Outcome: Progressing ?Goal: Individualized Educational Video(s) ?Outcome: Progressing ?  ?Problem: Coping: ?Goal: Will verbalize positive feelings about self ?Outcome: Progressing ?Goal: Will identify appropriate support needs ?Outcome: Progressing ?  ?Problem: Health Behavior/Discharge Planning: ?Goal: Ability to manage health-related needs will improve ?Outcome: Progressing ?  ?Problem: Self-Care: ?Goal: Ability to participate in self-care as condition permits will improve ?Outcome: Progressing ?Goal: Verbalization of feelings and concerns over difficulty with self-care will improve ?Outcome: Progressing ?Goal: Ability to communicate needs accurately will improve ?Outcome: Progressing ?  ?Problem: Nutrition: ?Goal: Risk of aspiration will decrease ?Outcome: Progressing ?  ?

## 2022-02-13 NOTE — Progress Notes (Signed)
TEE completed yesterday. Left ventricular ejection fraction, by estimation, is 60 to 65%. The left ventricle has no regional wall motion abnormalities. No left atrial/left atrial appendage thrombus was detected. Agitated saline contrast bubble study was negative, with no evidence of any interatrial shunt.  ? ?A/R: 76 y.o. female with a PMHx of DM2, HTN, CKD, HLD, history of breast cancer and right MCA stroke one week ago for which she was evaluated and treated at [facility], who presented to the [facility] ED on Monday with new onset of left facial droop. MRI revealed a small acute infarct of the medial ventral left thalamus, as well as an area of combined cortical and subcortical acute ischemia within the right MCA territory. ?- No changes to her current stroke management, based on TEE results.  ?- Continue DAPT with ASA and Plavix indefinitely.  ?- Will need outpatient Neurology follow up after discharge ?- Neurohospitalist service will sign off. Please call if there are additional questions.  ? ?Electronically signed: Dr. Kerney Elbe ? ? ? ? ? ?

## 2022-02-13 NOTE — Progress Notes (Signed)
[facility] Cardiology ? ? ? ?SUBJECTIVE: Patient slightly lethargic denies any pain has multiple medical problems recently suffered a CVA patient stable enough for discharge home will need physical therapy no chest pain no palpitations or tachycardia ? ? ?Vitals:  ? 02/12/22 2012 02/12/22 2318 02/13/22 0323 02/13/22 0802  ?BP: 123/74 (!) 171/87 (!) 167/84 (!) 164/89  ?Pulse: 77 75 98 85  ?Resp: '18 16 16 14  '$ ?Temp: 97.7 ?F (36.5 ?C) 97.9 ?F (36.6 ?C) 98.1 ?F (36.7 ?C) 98.3 ?F (36.8 ?C)  ?TempSrc:   Oral   ?SpO2: 98% 100% 100% 100%  ?Weight:      ?Height:      ? ? ? ?Intake/Output Summary (Last 24 hours) at 02/13/2022 1149 ?Last data filed at 02/13/2022 0554 ?Gross per 24 hour  ?Intake 2057.5 ml  ?Output 900 ml  ?Net 1157.5 ml  ? ? ? ? ?PHYSICAL EXAM ? ?General: Well developed, well nourished, in no acute distress ?HEENT:  Normocephalic and atramatic ?Neck:  No JVD.  ?Lungs: Clear bilaterally to auscultation and percussion. ?Heart: HRRR . Normal S1 and S2 without gallops or murmurs.  ?Abdomen: Bowel sounds are positive, abdomen soft and non-tender  ?Msk:  Back normal, normal gait. Normal strength and tone for age. ?Extremities: No clubbing, cyanosis or edema.   ?Neuro: Alert and oriented X 3.  Mildly lethargic ?Psych:  Good affect, responds appropriately ? ? ?LABS: ?Basic Metabolic Panel: ?Recent Labs  ?  02/11/22 ?0614 02/12/22 ?0507 02/13/22 ?0409  ?NA 141 141 143  ?K 4.1 4.0 4.5  ?CL 118* 119* 117*  ?CO2 18* 18* 18*  ?GLUCOSE 109* 104* 145*  ?BUN 39* 30* 32*  ?CREATININE 2.56* 2.34* 2.41*  ?CALCIUM 8.8* 8.7* 8.9  ?MG 1.7  --   --   ?PHOS 4.3  --   --   ? ?Liver Function Tests: ?Recent Labs  ?  02/11/22 ?0614  ?AST 10*  ?ALT <5  ?ALKPHOS 47  ?BILITOT 0.2*  ?PROT 6.1*  ?ALBUMIN 3.2*  ? ?No results for input(s): LIPASE, AMYLASE in the last 72 hours. ?CBC: ?Recent Labs  ?  02/12/22 ?0507 02/13/22 ?0409  ?WBC 3.8* 7.9  ?HGB 8.4* 8.5*  ?HCT 26.6* 26.9*  ?MCV 90.2 91.2  ?PLT 82* 76*  ? ?Cardiac Enzymes: ?No results for input(s):  CKTOTAL, CKMB, CKMBINDEX, TROPONINI in the last 72 hours. ?BNP: ?Invalid input(s): POCBNP ?D-Dimer: ?No results for input(s): DDIMER in the last 72 hours. ?Hemoglobin A1C: ?No results for input(s): HGBA1C in the last 72 hours. ?Fasting Lipid Panel: ?No results for input(s): CHOL, HDL, LDLCALC, TRIG, CHOLHDL, LDLDIRECT in the last 72 hours. ?Thyroid Function Tests: ?No results for input(s): TSH, T4TOTAL, T3FREE, THYROIDAB in the last 72 hours. ? ?Invalid input(s): FREET3 ?Anemia Panel: ?No results for input(s): VITAMINB12, FOLATE, FERRITIN, TIBC, IRON, RETICCTPCT in the last 72 hours. ? ?ECHO TEE ? ?Result Date: 02/12/2022 ?   TRANSESOPHOGEAL ECHO REPORT   Patient Name:   [name] Josephs Date of Exam: 02/12/2022 Medical Rec #:  [id number]      Height:       60.0 in Accession #:    [id number]     Weight:       115.3 lb Date of Birth:  1946/03/25       BSA:          1.477 m? Patient Age:    76 years       BP:           149/83 mmHg Patient  Gender: F              HR:           77 bpm. Exam Location:  [facility] Procedure: Transesophageal Echo, Color Doppler, Cardiac Doppler and Saline            Contrast Bubble Study Indications:     Cerebral Infarction, Unspecified I63.9  History:         Patient has no prior history of Echocardiogram examinations.                  Risk Factors:Hypertension and Diabetes.  Sonographer:     Sherrie Sport Referring Phys:  [id number] AMBER SCOGGINS Diagnosing Phys: [place]: The transesophogeal probe was passed without difficulty through the esophogus of the patient. Local oropharyngeal anesthetic was provided with viscous lidocaine and Benzocaine spray. Sedation performed by performing physician. The patient's vital signs; including heart rate, blood pressure, and oxygen saturation; remained stable throughout the procedure. The patient developed no complications during the procedure. IMPRESSIONS  1. Left ventricular ejection fraction, by estimation, is 60 to 65%. The left ventricle has normal  function. The left ventricle has no regional wall motion abnormalities.  2. Right ventricular systolic function is normal. The right ventricular size is normal.  3. No left atrial/left atrial appendage thrombus was detected.  4. The mitral valve is normal in structure. Mild mitral valve regurgitation. No evidence of mitral stenosis.  5. The aortic valve is normal in structure. Aortic valve regurgitation is not visualized. No aortic stenosis is present.  6. The inferior vena cava is normal in size with greater than 50% respiratory variability, suggesting right atrial pressure of 3 mmHg.  7. Agitated saline contrast bubble study was negative, with no evidence of any interatrial shunt. Comparison(s): Sedation time 10 minutes. Conclusion(s)/Recommendation(s): Normal biventricular function without evidence of hemodynamically significant valvular heart disease. FINDINGS  Left Ventricle: Left ventricular ejection fraction, by estimation, is 60 to 65%. The left ventricle has normal function. The left ventricle has no regional wall motion abnormalities. The left ventricular internal cavity size was normal in size. There is  no left ventricular hypertrophy. Right Ventricle: The right ventricular size is normal. No increase in right ventricular wall thickness. Right ventricular systolic function is normal. Left Atrium: Left atrial size was normal in size. No left atrial/left atrial appendage thrombus was detected. Right Atrium: Right atrial size was normal in size. Pericardium: There is no evidence of pericardial effusion. Mitral Valve: The mitral valve is normal in structure. Mild mitral valve regurgitation. No evidence of mitral valve stenosis. Tricuspid Valve: The tricuspid valve is normal in structure. Tricuspid valve regurgitation is trivial. No evidence of tricuspid stenosis. Aortic Valve: The aortic valve is normal in structure. Aortic valve regurgitation is not visualized. No aortic stenosis is present. Pulmonic Valve:  The pulmonic valve was normal in structure. Pulmonic valve regurgitation is not visualized. No evidence of pulmonic stenosis. Aorta: The aortic root is normal in size and structure. Venous: The inferior vena cava is normal in size with greater than 50% respiratory variability, suggesting right atrial pressure of 3 mmHg. IAS/Shunts: No atrial level shunt detected by color flow Doppler. Agitated saline contrast was given intravenously to evaluate for intracardiac shunting. Agitated saline contrast bubble study was negative, with no evidence of any interatrial shunt. There  is no evidence of a patent foramen ovale. No ventricular septal defect is seen or detected. Neoma Laming Electronically signed by Neoma Laming Signature Date/Time: 02/12/2022/10:47:58 AM  Final    ? ? ?Echo preserved overall left ventricular function EF around 60% ? ?TELEMETRY: Normal sinus rhythm nonspecific ST-T wave changes rate of around 75: ? ?ASSESSMENT AND PLAN: ? ?Principal Problem: ?  Acute CVA (cerebrovascular accident), recurrent(HCC) ?Active Problems: ?  Essential hypertension ?  Malignant neoplasm of overlapping sites of right breast in female, estrogen receptor negative ([place]) ?  Glaucoma ?  Diabetic polyneuropathy ([place]) ?  Anemia of chronic kidney failure, stage 4 (severe) (HCC) ?  Type 2 diabetes mellitus with chronic kidney disease, without long-term current use of insulin ([place]) ?  Hyperlipidemia ?  Acute renal failure superimposed on stage 4 chronic kidney disease ([place]) ?  ? ?Plan ?Continue current therapy continue aspirin Plavix statin therapy ?2 hypertension reasonably controlled amlodipine ?3 diabetes type 2 uncomplicated poorly controlled continue aggressive diabetes management and control patient refuses insulin ?4 hyperlipidemia chronic stable maintain statin therapy for lipid management ?5 acute on chronic renal insufficiency creatinine of around 2.5 recommend medical hydration follow-up with nephrology as an outpatient ?6  malignant neoplasm breast follow-up with oncology ?7 anemia chronic failure of stage IV chronic anemia of chronic disease ?8 have the patient follow-up with cardiology as an outpatient ? ?Gordana Kewley D Callw

## 2023-09-28 IMAGING — CT CT HEAD CODE STROKE
4 series · 15 of 47 positions shown, 17 images · non-contrast
Comparison: CT head 12/06/2014, brain MRI 12/05/2013

CLINICAL DATA: Code stroke.  Left facial droop



[Series 3: head bone · axial · 0.40mm/px · z∈[-31,-17]mm · 2 of 72 slices shown]
[im 8/72  bone]
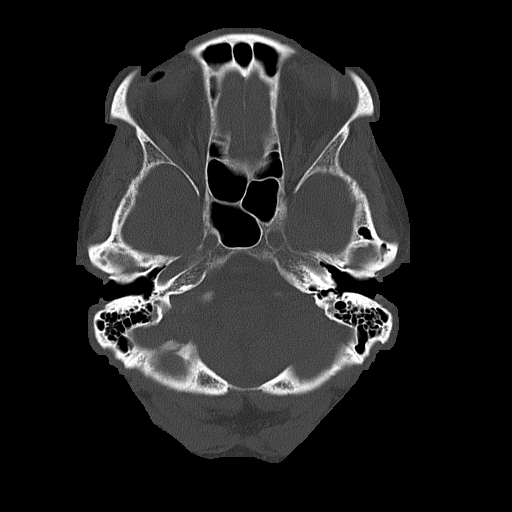
[im 15/72  bone]
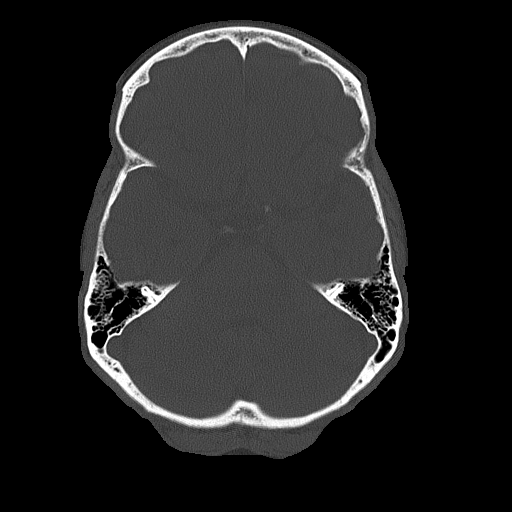

[Series 4: head wo · axial · 0.40mm/px · z∈[-30,+75]mm · 7 of 29 slices shown, 9 images]
[im 4/29  brain]
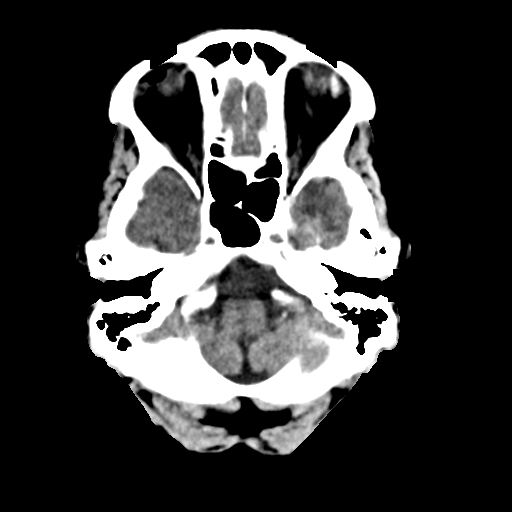
[im 4/29  bone]
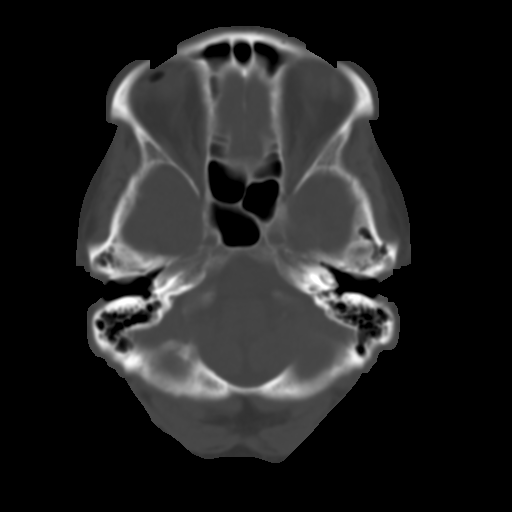
[im 8/29  brain]
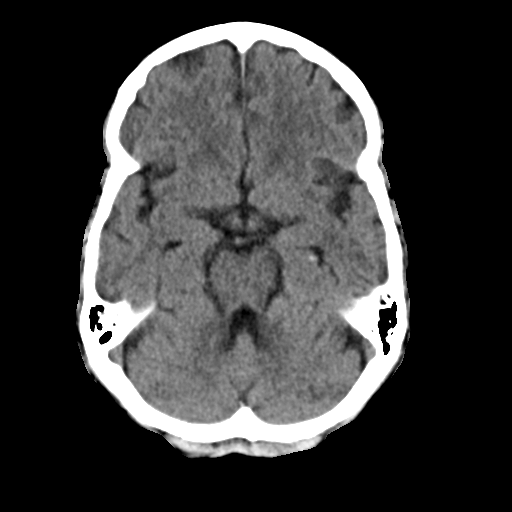
[im 11/29  brain]
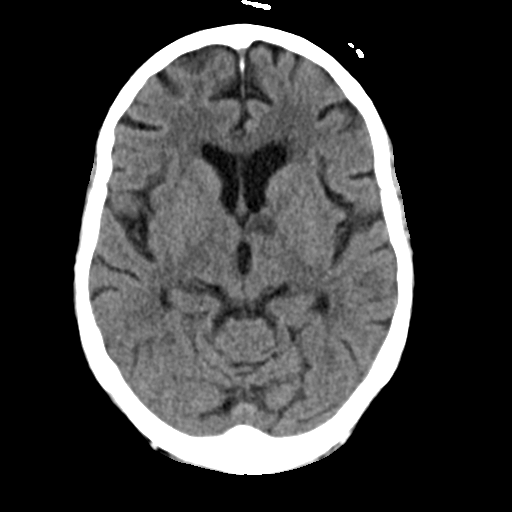
[im 15/29  brain]
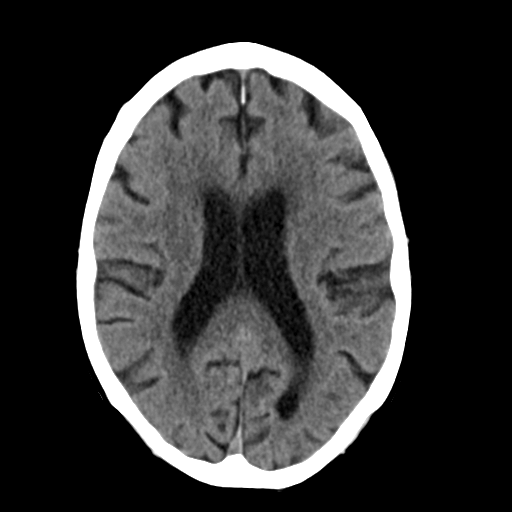
[im 18/29  brain]
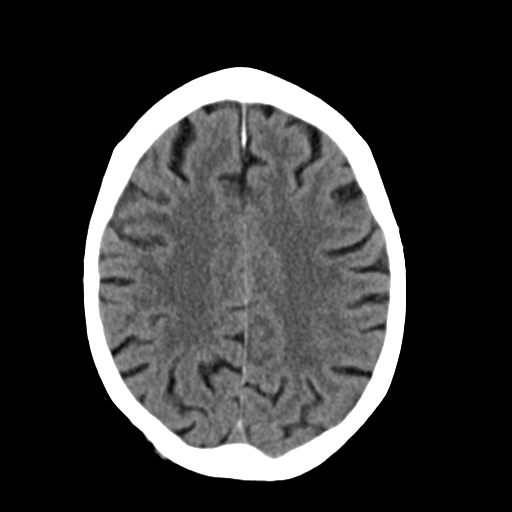
[im 18/29  bone]
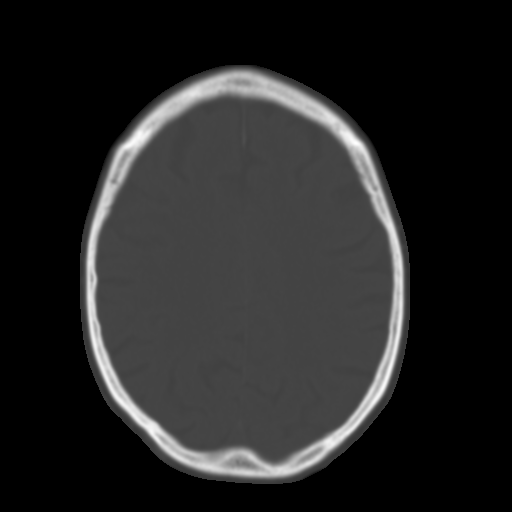
[im 22/29  brain]
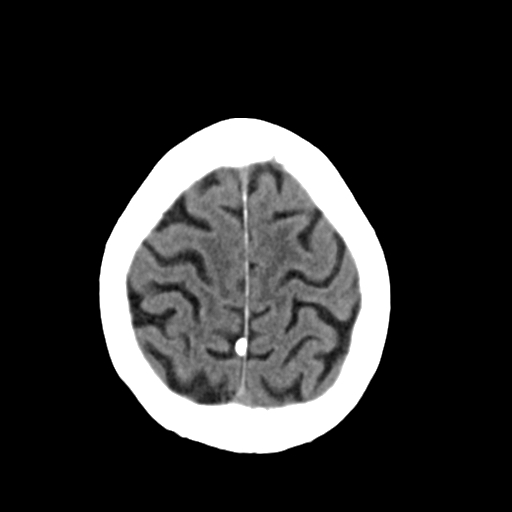
[im 25/29  brain]
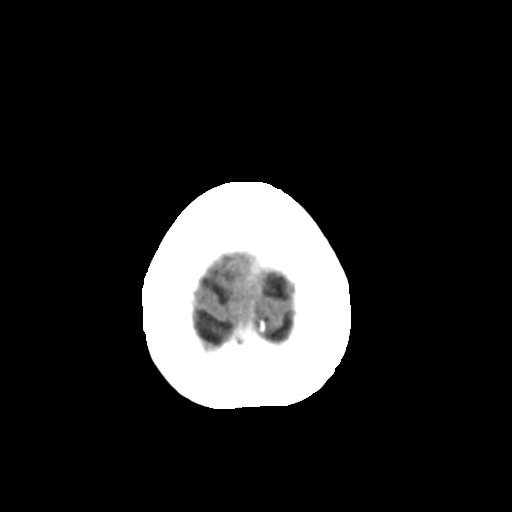

[Series 5: coronal soft tissue · coronal · 0.29mm/px · 3 of 64 slices shown]
[im 22/64  brain]
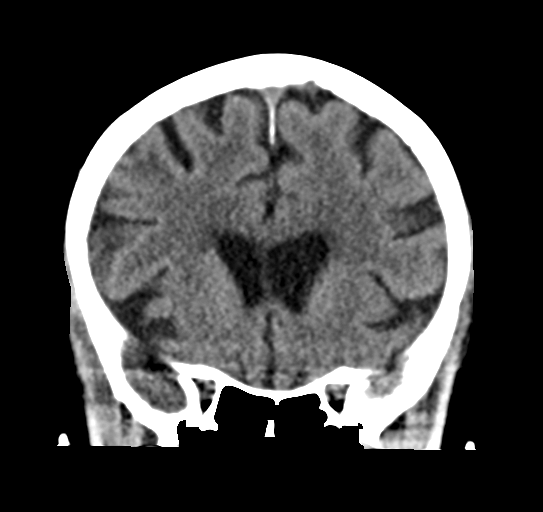
[im 29/64  brain]
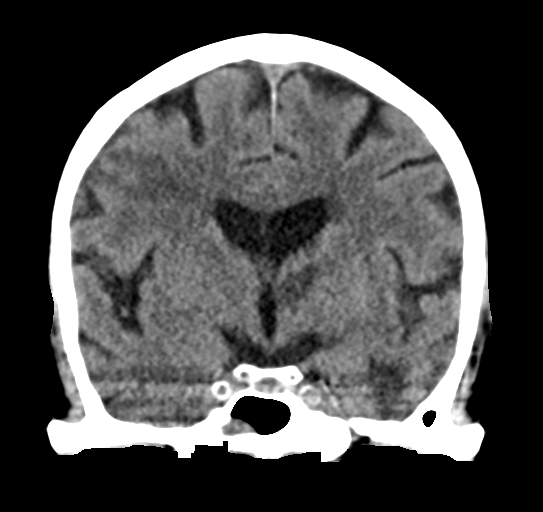
[im 36/64  brain]
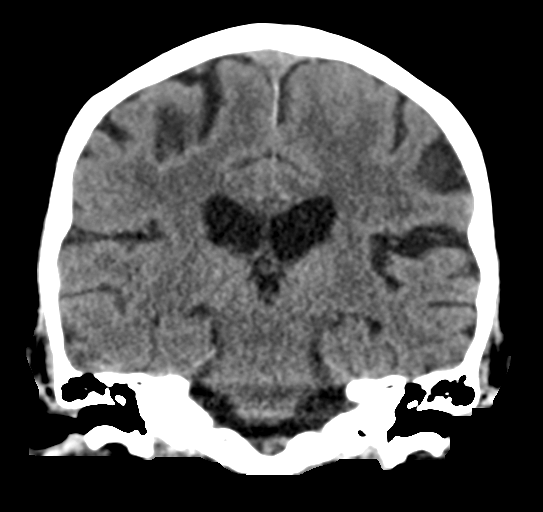

[Series 6: sagittal soft tissue · sagittal · 0.29mm/px · 3 of 54 slices shown]
[im 18/54  brain]
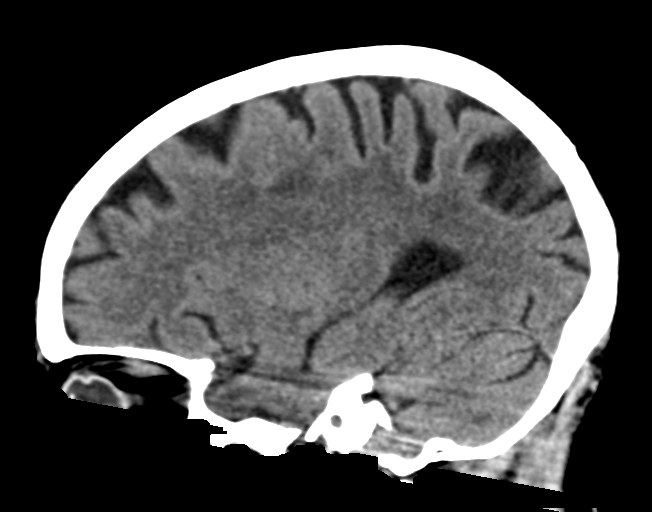
[im 27/54  brain]
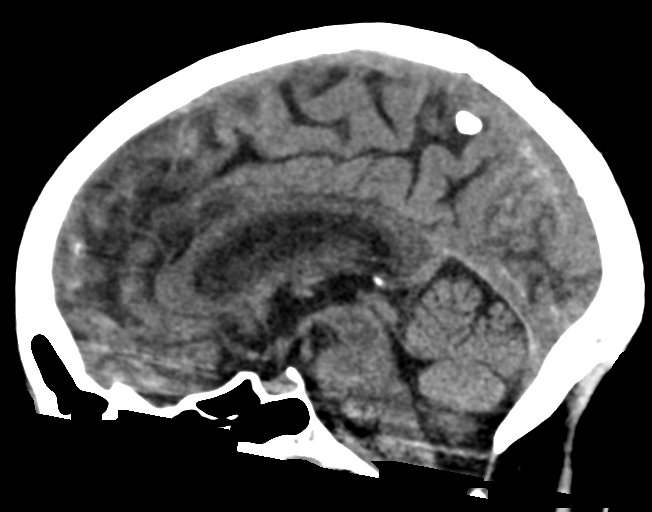
[im 36/54  brain]
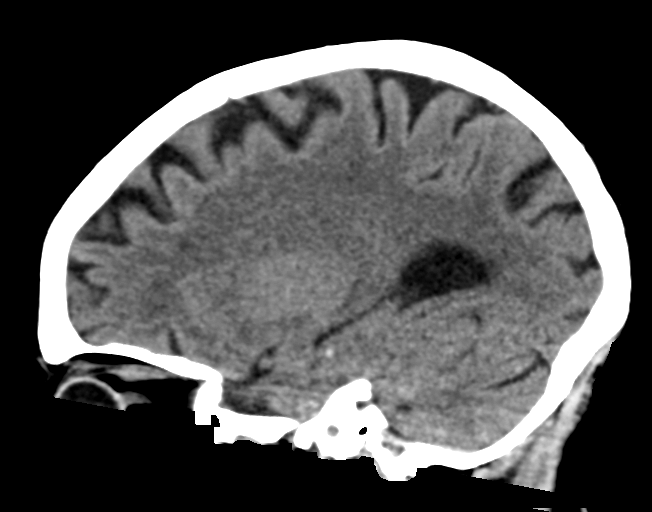

[15 of 47 positions shown; findings below may reference images not displayed]

FINDINGS: Brain: There is hypodensity in the right frontal lobe involving the
precentral gyrus which is new since 8908 and is suspicious for acute
infarct. There is also focal hypodensity in the left ventral
thalamus/internal capsule which is new since 8908 consistent with
age-indeterminate infarct.

There is no acute intracranial hemorrhage or extra-axial fluid
collection.

Background parenchymal volume is normal for age. The ventricles are
normal in size. There is encephalomalacia in the left anterior
temporal lobe consistent with sequela of prior infarct or trauma
increased in conspicuity since 8908, though this may be due to
artifact on the prior study. Additional foci of hypodensity in the
subcortical and periventricular white matter likely reflects sequela
of chronic white matter microangiopathy.

There is no mass lesion.  There is no mass effect or midline shift.

Vascular: There is calcification of the bilateral cavernous ICAs.

Skull: Normal. Negative for fracture or focal lesion.

Sinuses/Orbits: There is a mucous retention cyst in the right
sphenoid sinus. Bilateral lens implants and a left glaucoma valve
are noted. The right globe is atrophic compared to the left,
new/progressed since 8908.

Other: None.

ASPECTS ([place] Stroke Program Early CT Score)

- Ganglionic level infarction (caudate, lentiform nuclei, internal
capsule, insula, M1-M3 cortex): 7

- Supraganglionic infarction (M4-M6 cortex): 2

Total score (0-10 with 10 being normal): 9
IMPRESSION: 1. Hypodensity in the right frontal lobe involving the precentral
gyrus is new since 8908 and could reflect acute infarct. Additional
hypodensity in the left thalamic capsular region is new since 8908,
consistent with age-indeterminate but possibly acute infarct.
2. ASPECTS is 9
3. No acute intracranial hemorrhage.
4. Encephalomalacia in the left anterior temporal lobe consistent
with sequela of prior infarct or trauma, increased in conspicuity
since 8908, though this may be due to artifact on the prior study.
5. Atrophy of the right globe compared to the left, new/progressed
since 8908.

These results were called by telephone at the time of interpretation
on 02/09/2022 at [DATE] to provider DINIS TIGER , who verbally
acknowledged these results.

## 2024-03-26 DEATH — deceased
